# Patient Record
Sex: Female | Born: 1955 | Race: Black or African American | Hispanic: No | Marital: Married | Smoking: Never smoker
Health system: Southern US, Community
[De-identification: ages and names within clinical notes are randomized; demographics above are authoritative.]

## PROBLEM LIST (undated history)

## (undated) DIAGNOSIS — G459 Transient cerebral ischemic attack, unspecified: Secondary | ICD-10-CM

## (undated) DIAGNOSIS — F32A Depression, unspecified: Secondary | ICD-10-CM

## (undated) DIAGNOSIS — G4733 Obstructive sleep apnea (adult) (pediatric): Secondary | ICD-10-CM

## (undated) DIAGNOSIS — I10 Essential (primary) hypertension: Secondary | ICD-10-CM

## (undated) DIAGNOSIS — E7800 Pure hypercholesterolemia, unspecified: Secondary | ICD-10-CM

## (undated) DIAGNOSIS — F419 Anxiety disorder, unspecified: Secondary | ICD-10-CM

## (undated) DIAGNOSIS — L299 Pruritus, unspecified: Secondary | ICD-10-CM

## (undated) DIAGNOSIS — E039 Hypothyroidism, unspecified: Secondary | ICD-10-CM

## (undated) DIAGNOSIS — E119 Type 2 diabetes mellitus without complications: Secondary | ICD-10-CM

## (undated) DIAGNOSIS — E876 Hypokalemia: Secondary | ICD-10-CM

## (undated) DIAGNOSIS — T4145XA Adverse effect of unspecified anesthetic, initial encounter: Secondary | ICD-10-CM

## (undated) DIAGNOSIS — F329 Major depressive disorder, single episode, unspecified: Secondary | ICD-10-CM

## (undated) DIAGNOSIS — T8859XA Other complications of anesthesia, initial encounter: Secondary | ICD-10-CM

## (undated) DIAGNOSIS — G35 Multiple sclerosis: Secondary | ICD-10-CM

## (undated) HISTORY — PX: PARTIAL HYSTERECTOMY: SHX80

## (undated) HISTORY — DX: Multiple sclerosis: G35

## (undated) HISTORY — DX: Pure hypercholesterolemia, unspecified: E78.00

## (undated) HISTORY — DX: Obstructive sleep apnea (adult) (pediatric): G47.33

## (undated) HISTORY — DX: Hypokalemia: E87.6

## (undated) HISTORY — PX: OTHER SURGICAL HISTORY: SHX169

## (undated) HISTORY — DX: Pruritus, unspecified: L29.9

## (undated) HISTORY — DX: Essential (primary) hypertension: I10

## (undated) HISTORY — DX: Hypothyroidism, unspecified: E03.9

---

## 1998-07-03 ENCOUNTER — Ambulatory Visit (HOSPITAL_COMMUNITY): Admission: RE | Admit: 1998-07-03 | Discharge: 1998-07-03 | Payer: Self-pay | Admitting: Internal Medicine

## 1998-10-24 ENCOUNTER — Emergency Department (HOSPITAL_COMMUNITY): Admission: EM | Admit: 1998-10-24 | Discharge: 1998-10-24 | Payer: Self-pay | Admitting: Emergency Medicine

## 1999-09-26 ENCOUNTER — Other Ambulatory Visit: Admission: RE | Admit: 1999-09-26 | Discharge: 1999-09-26 | Payer: Self-pay | Admitting: Internal Medicine

## 1999-09-26 ENCOUNTER — Encounter: Payer: Self-pay | Admitting: Internal Medicine

## 1999-09-26 ENCOUNTER — Encounter: Admission: RE | Admit: 1999-09-26 | Discharge: 1999-09-26 | Payer: Self-pay | Admitting: Internal Medicine

## 1999-12-15 ENCOUNTER — Ambulatory Visit (HOSPITAL_COMMUNITY): Admission: RE | Admit: 1999-12-15 | Discharge: 1999-12-15 | Payer: Self-pay | Admitting: Pediatrics

## 1999-12-15 ENCOUNTER — Encounter: Payer: Self-pay | Admitting: Pediatrics

## 1999-12-29 ENCOUNTER — Encounter: Payer: Self-pay | Admitting: Internal Medicine

## 1999-12-29 ENCOUNTER — Ambulatory Visit (HOSPITAL_COMMUNITY): Admission: RE | Admit: 1999-12-29 | Discharge: 1999-12-29 | Payer: Self-pay | Admitting: Internal Medicine

## 2000-12-27 ENCOUNTER — Encounter: Payer: Self-pay | Admitting: Psychiatry

## 2000-12-27 ENCOUNTER — Ambulatory Visit (HOSPITAL_COMMUNITY): Admission: RE | Admit: 2000-12-27 | Discharge: 2000-12-27 | Payer: Self-pay | Admitting: Psychiatry

## 2002-04-26 ENCOUNTER — Emergency Department (HOSPITAL_COMMUNITY): Admission: EM | Admit: 2002-04-26 | Discharge: 2002-04-26 | Payer: Self-pay | Admitting: Emergency Medicine

## 2002-04-26 ENCOUNTER — Encounter: Payer: Self-pay | Admitting: Emergency Medicine

## 2002-05-07 ENCOUNTER — Encounter: Payer: Self-pay | Admitting: Internal Medicine

## 2002-05-07 ENCOUNTER — Encounter: Admission: RE | Admit: 2002-05-07 | Discharge: 2002-05-07 | Payer: Self-pay | Admitting: Internal Medicine

## 2003-10-15 ENCOUNTER — Encounter: Admission: RE | Admit: 2003-10-15 | Discharge: 2003-10-15 | Payer: Self-pay | Admitting: Neurosurgery

## 2003-10-29 ENCOUNTER — Encounter: Admission: RE | Admit: 2003-10-29 | Discharge: 2003-10-29 | Payer: Self-pay | Admitting: Neurosurgery

## 2003-11-09 ENCOUNTER — Encounter: Admission: RE | Admit: 2003-11-09 | Discharge: 2003-11-09 | Payer: Self-pay | Admitting: Neurosurgery

## 2003-12-27 ENCOUNTER — Encounter: Admission: RE | Admit: 2003-12-27 | Discharge: 2003-12-27 | Payer: Self-pay | Admitting: Neurosurgery

## 2004-01-17 ENCOUNTER — Encounter: Admission: RE | Admit: 2004-01-17 | Discharge: 2004-01-17 | Payer: Self-pay | Admitting: Internal Medicine

## 2004-10-12 ENCOUNTER — Ambulatory Visit (HOSPITAL_COMMUNITY): Admission: RE | Admit: 2004-10-12 | Discharge: 2004-10-12 | Payer: Self-pay | Admitting: Obstetrics and Gynecology

## 2005-10-24 ENCOUNTER — Ambulatory Visit (HOSPITAL_BASED_OUTPATIENT_CLINIC_OR_DEPARTMENT_OTHER): Admission: RE | Admit: 2005-10-24 | Discharge: 2005-10-24 | Payer: Self-pay | Admitting: Internal Medicine

## 2005-10-28 ENCOUNTER — Ambulatory Visit: Payer: Self-pay | Admitting: Internal Medicine

## 2005-11-27 ENCOUNTER — Ambulatory Visit (HOSPITAL_BASED_OUTPATIENT_CLINIC_OR_DEPARTMENT_OTHER): Admission: RE | Admit: 2005-11-27 | Discharge: 2005-11-27 | Payer: Self-pay | Admitting: Internal Medicine

## 2005-11-29 ENCOUNTER — Ambulatory Visit: Payer: Self-pay | Admitting: Internal Medicine

## 2005-12-02 ENCOUNTER — Ambulatory Visit: Payer: Self-pay | Admitting: Internal Medicine

## 2005-12-27 ENCOUNTER — Ambulatory Visit: Payer: Self-pay | Admitting: Internal Medicine

## 2006-02-27 ENCOUNTER — Ambulatory Visit (HOSPITAL_COMMUNITY): Admission: RE | Admit: 2006-02-27 | Discharge: 2006-02-27 | Payer: Self-pay | Admitting: Internal Medicine

## 2006-03-07 ENCOUNTER — Encounter: Admission: RE | Admit: 2006-03-07 | Discharge: 2006-03-07 | Payer: Self-pay | Admitting: Internal Medicine

## 2006-03-12 ENCOUNTER — Ambulatory Visit: Payer: Self-pay | Admitting: Internal Medicine

## 2006-05-03 ENCOUNTER — Encounter: Admission: RE | Admit: 2006-05-03 | Discharge: 2006-05-03 | Payer: Self-pay | Admitting: Internal Medicine

## 2007-03-10 ENCOUNTER — Encounter: Admission: RE | Admit: 2007-03-10 | Discharge: 2007-03-10 | Payer: Self-pay | Admitting: Internal Medicine

## 2007-03-14 ENCOUNTER — Encounter: Admission: RE | Admit: 2007-03-14 | Discharge: 2007-03-14 | Payer: Self-pay | Admitting: Internal Medicine

## 2007-10-13 ENCOUNTER — Encounter: Admission: RE | Admit: 2007-10-13 | Discharge: 2007-10-13 | Payer: Self-pay | Admitting: Internal Medicine

## 2007-10-21 ENCOUNTER — Encounter: Admission: RE | Admit: 2007-10-21 | Discharge: 2007-10-21 | Payer: Self-pay | Admitting: *Deleted

## 2008-03-10 ENCOUNTER — Encounter: Admission: RE | Admit: 2008-03-10 | Discharge: 2008-03-10 | Payer: Self-pay | Admitting: Internal Medicine

## 2010-02-21 ENCOUNTER — Ambulatory Visit (HOSPITAL_COMMUNITY): Admission: RE | Admit: 2010-02-21 | Discharge: 2010-02-21 | Payer: Self-pay | Admitting: Psychiatry

## 2010-05-16 ENCOUNTER — Encounter: Admission: RE | Admit: 2010-05-16 | Discharge: 2010-05-16 | Payer: Self-pay | Admitting: Psychiatry

## 2010-10-28 ENCOUNTER — Encounter: Payer: Self-pay | Admitting: Neurosurgery

## 2010-10-29 ENCOUNTER — Encounter: Payer: Self-pay | Admitting: Psychiatry

## 2010-10-29 ENCOUNTER — Encounter: Payer: Self-pay | Admitting: Internal Medicine

## 2010-10-30 ENCOUNTER — Encounter: Payer: Self-pay | Admitting: Internal Medicine

## 2011-01-18 ENCOUNTER — Encounter: Payer: Self-pay | Admitting: Internal Medicine

## 2011-02-23 NOTE — Procedures (Signed)
NAME:  Kendra Simon, Kendra Simon NO.:  [id number]   MEDICAL RECORD NO.:  [id number]          PATIENT TYPE:  OUT   LOCATION:  SLEEP CENTER                 FACILITY:  [facility]   PHYSICIAN:  Clinton D. Maple Hudson, M.D. DATE OF BIRTH:  1955-12-07   DATE OF STUDY:  10/24/2005                              NOCTURNAL POLYSOMNOGRAM   REFERRING PHYSICIAN:  Dr. Willey Blade   INDICATION FOR STUDY:  Hypersomnia with sleep apnea. Epworth sleepiness  score 13/24, weight 198 pounds.   SLEEP ARCHITECTURE:  Total sleep time 347 minutes with sleep efficiency 88%.  Stage 1 was 3%, stage 2 74%, stages 3 and 4 15%, REM 8% of total sleep time.  Sleep latency 33 minutes, REM latency 183 minutes, awake after sleep onset  16 minutes, arousal index 6.7. She had taken an injection for her multiple  sclerosis prior to lights out. No other bedtime medication reported.   RESPIRATORY DATA:  Apnea/hypopnea index (AHI, RDI) 26.8 obstructive events  per hour indicating moderate obstructive sleep apnea/hypopnea syndrome. This  included two central apneas, 26 obstructive apneas, and 127 hypopneas. Most  sleep was on back. Events were not positional, equally reported while supine  and on either side. REM AHI 30.5 per hour. Events occurred late in the night  and technician indicates that there were insufficient early events to permit  use of CPAP titration by split protocol on this study night.   OXYGEN DATA:  Loud variable snoring with oxygen desaturation to a nadir of  87%. Mean oxygen saturation through the study was 95% on room air.   CARDIAC DATA:  Normal sinus rhythm.   MOVEMENT/PARASOMNIA:  Occasional leg jerk with little effect on sleep.   IMPRESSION/RECOMMENDATION:  1.  Moderate obstructive sleep apnea/hypopnea syndrome, AHI 26.8 per hour      with snoring variable and sometimes loud, oxygen desaturation to 87%.      Events were not positional.  2.  Consider return for CPAP titration or evaluate for  alternative therapies      as appropriate.      Clinton D. Maple Hudson, M.D.  Diplomate, Biomedical engineer of Sleep Medicine  Electronically Signed     CDY/MEDQ  D:  10/28/2005 12:18:35  T:  10/29/2005 16:56:37  Job:  [id number]

## 2011-02-23 NOTE — Procedures (Signed)
NAME:  DEEPTI, GUNAWAN NO.:  [id number]   MEDICAL RECORD NO.:  [id number]          PATIENT TYPE:  OUT   LOCATION:  SLEEP CENTER                 FACILITY:  [facility]   PHYSICIAN:  Clinton D. Maple Hudson, M.D. DATE OF BIRTH:  [birth year]   DATE OF STUDY:  11/27/2005                              NOCTURNAL POLYSOMNOGRAM   REFERRING PHYSICIAN:  Dr. Willey Blade.   DATE OF STUDY:  November 27, 2005.   INDICATION FOR STUDY:  Hypersomnia with sleep apnea.   EPWORTH SLEEPINESS SCORE:  14/24.   BMI:  35.   WEIGHT:  198 pounds.   A baseline diagnostic NPSG on October 24, 2005 had reported an AHI of 26.8  per hour. C-PAP titration is requested.   HOME MEDICATIONS:  Xanax, Prozac, Maxzide, Caduet, Betaseron.   SLEEP ARCHITECTURE:  Total sleep time 325 minutes with sleep efficiency 82%.  Stage I was 15%, stage II 63%, stages III and IV 10%, REM 12% of total sleep  time. Sleep latency 11 minutes, REM latency 342 minutes, awake after sleep  onset 61 minutes, arousal index 21.9. She had taken an injection for her  multiple sclerosis prior to lights out.   RESPIRATORY DATA:  C-PAP titration protocol: C-PAP was titrated to 9 CWP,  AHI 4.2 per hour. A small Respironics ComfortGel mask was used with a heated  humidifier. She was able to sleep with this but indicated on morning  questionnaire that she had felt smothered and frightened initially with C-  PAP.   OXYGEN DATA:  Snoring was prevented and oxygen saturation held at 97-98% on  C-PAP.   CARDIAC DATA:  Normal sinus rhythm.   MOVEMENT/PARASOMNIA:  A total of 39 limb jerks were reported of which 19  were associated with arousal or awakening for a periodic limb movement with  arousal index of 3.5 per hour which is mildly increased but may be affected  by the disturbances C-PAP titration on the study night.   IMPRESSION/RECOMMENDATIONS:  1.  Successful C-PAP titration to 9 CWP, AHI 4.2 per hour using a small      Respironics  ComfortGel mask with heated humidifier. Note that she was      uncomfortable with the pressure sensation of C-PAP at least initially.  2.  Baseline diagnostic NPSG on October 24, 2005 had reported an AHI of 26.8      per hour.  3.  Mild periodic limb movement with arousal, 3.5 per hour.      Clinton D. Maple Hudson, M.D.  Diplomate, Biomedical engineer of Sleep Medicine  Electronically Signed     CDY/MEDQ  D:  12/02/2005 10:47:55  T:  12/02/2005 22:41:30  Job:  [id number]

## 2011-04-17 ENCOUNTER — Encounter (HOSPITAL_COMMUNITY): Payer: Self-pay | Admitting: Radiology

## 2011-04-17 ENCOUNTER — Emergency Department (HOSPITAL_COMMUNITY): Payer: 59

## 2011-04-17 ENCOUNTER — Observation Stay (HOSPITAL_COMMUNITY)
Admission: EM | Admit: 2011-04-17 | Discharge: 2011-04-18 | DRG: 069 | Disposition: A | Discharge status: Home / Self Care | Payer: 59 | Attending: Internal Medicine | Admitting: Internal Medicine

## 2011-04-17 DIAGNOSIS — E785 Hyperlipidemia, unspecified: Secondary | ICD-10-CM | POA: Diagnosis present

## 2011-04-17 DIAGNOSIS — E039 Hypothyroidism, unspecified: Secondary | ICD-10-CM | POA: Diagnosis present

## 2011-04-17 DIAGNOSIS — F341 Dysthymic disorder: Secondary | ICD-10-CM | POA: Diagnosis present

## 2011-04-17 DIAGNOSIS — R609 Edema, unspecified: Secondary | ICD-10-CM | POA: Diagnosis present

## 2011-04-17 DIAGNOSIS — I10 Essential (primary) hypertension: Secondary | ICD-10-CM | POA: Diagnosis present

## 2011-04-17 DIAGNOSIS — R51 Headache: Secondary | ICD-10-CM | POA: Diagnosis present

## 2011-04-17 DIAGNOSIS — G459 Transient cerebral ischemic attack, unspecified: Principal | ICD-10-CM | POA: Diagnosis present

## 2011-04-17 DIAGNOSIS — G35 Multiple sclerosis: Secondary | ICD-10-CM | POA: Diagnosis present

## 2011-04-17 HISTORY — DX: Transient cerebral ischemic attack, unspecified: G45.9

## 2011-04-17 LAB — POCT I-STAT, CHEM 8
BUN: 18 mg/dL (ref 6–23)
Calcium, Ion: 1.05 mmol/L — ABNORMAL LOW (ref 1.12–1.32)
Creatinine, Ser: 0.9 mg/dL (ref 0.50–1.10)
Hemoglobin: 12.6 g/dL (ref 12.0–15.0)
TCO2: 26 mmol/L (ref 0–100)

## 2011-04-17 LAB — DIFFERENTIAL
Basophils Relative: 0 % (ref 0–1)
Eosinophils Absolute: 0.1 10*3/uL (ref 0.0–0.7)
Eosinophils Relative: 1 % (ref 0–5)
Monocytes Absolute: 0.4 10*3/uL (ref 0.1–1.0)
Monocytes Relative: 6 % (ref 3–12)

## 2011-04-17 LAB — CBC
Hemoglobin: 12.6 g/dL (ref 12.0–15.0)
MCH: 32 pg (ref 26.0–34.0)
MCHC: 34.8 g/dL (ref 30.0–36.0)
Platelets: 298 10*3/uL (ref 150–400)
RDW: 12.5 % (ref 11.5–15.5)

## 2011-04-18 ENCOUNTER — Inpatient Hospital Stay (HOSPITAL_COMMUNITY): Payer: 59

## 2011-04-18 DIAGNOSIS — G459 Transient cerebral ischemic attack, unspecified: Secondary | ICD-10-CM

## 2011-04-18 DIAGNOSIS — M79609 Pain in unspecified limb: Secondary | ICD-10-CM

## 2011-04-18 DIAGNOSIS — I059 Rheumatic mitral valve disease, unspecified: Secondary | ICD-10-CM

## 2011-04-18 LAB — COMPREHENSIVE METABOLIC PANEL
Albumin: 3.3 g/dL — ABNORMAL LOW (ref 3.5–5.2)
BUN: 16 mg/dL (ref 6–23)
Calcium: 9 mg/dL (ref 8.4–10.5)
Chloride: 103 mEq/L (ref 96–112)
Creatinine, Ser: 0.72 mg/dL (ref 0.50–1.10)
Total Bilirubin: 0.2 mg/dL — ABNORMAL LOW (ref 0.3–1.2)
Total Protein: 6.7 g/dL (ref 6.0–8.3)

## 2011-04-18 LAB — CARDIAC PANEL(CRET KIN+CKTOT+MB+TROPI)
CK, MB: 3 ng/mL (ref 0.3–4.0)
Relative Index: 0.9 (ref 0.0–2.5)
Total CK: 318 U/L — ABNORMAL HIGH (ref 7–177)
Troponin I: <0.3 ng/mL (ref <0.30)

## 2011-04-18 LAB — URINALYSIS, MICROSCOPIC ONLY
Glucose, UA: NEGATIVE mg/dL
Hgb urine dipstick: NEGATIVE
Ketones, ur: NEGATIVE mg/dL
Leukocytes, UA: NEGATIVE
Protein, ur: NEGATIVE mg/dL
Urobilinogen, UA: 1 mg/dL (ref 0.0–1.0)

## 2011-04-18 LAB — APTT: aPTT: 32 seconds (ref 24–37)

## 2011-04-18 LAB — CBC
HCT: 35.3 % — ABNORMAL LOW (ref 36.0–46.0)
Hemoglobin: 11.9 g/dL — ABNORMAL LOW (ref 12.0–15.0)
MCH: 30.9 pg (ref 26.0–34.0)
MCV: 91.7 fL (ref 78.0–100.0)
RBC: 3.85 MIL/uL — ABNORMAL LOW (ref 3.87–5.11)
WBC: 6.3 10*3/uL (ref 4.0–10.5)

## 2011-04-18 LAB — GLUCOSE, CAPILLARY: Glucose-Capillary: 140 mg/dL — ABNORMAL HIGH (ref 70–99)

## 2011-04-18 LAB — HEMOGLOBIN A1C: Hgb A1c MFr Bld: 6.7 % — ABNORMAL HIGH (ref <5.7)

## 2011-04-18 LAB — DIFFERENTIAL
Lymphocytes Relative: 26 % (ref 12–46)
Lymphs Abs: 1.6 10*3/uL (ref 0.7–4.0)
Monocytes Relative: 9 % (ref 3–12)
Neutrophils Relative %: 63 % (ref 43–77)

## 2011-04-18 LAB — MAGNESIUM: Magnesium: 2.1 mg/dL (ref 1.5–2.5)

## 2011-04-18 LAB — PHOSPHORUS: Phosphorus: 3 mg/dL (ref 2.3–4.6)

## 2011-04-18 LAB — T4, FREE: Free T4: 1 ng/dL (ref 0.80–1.80)

## 2011-04-18 LAB — LIPID PANEL
Cholesterol: 168 mg/dL (ref 0–200)
HDL: 37 mg/dL — ABNORMAL LOW (ref >39)

## 2011-04-18 LAB — VITAMIN B12: Vitamin B-12: 296 pg/mL (ref 211–911)

## 2011-04-19 LAB — FOLATE RBC: RBC Folate: 359 ng/mL — ABNORMAL LOW (ref >=366)

## 2011-04-19 NOTE — H&P (Signed)
NAME:  Kendra Simon, Kendra Simon NO.:  [id number]  MEDICAL RECORD NO.:  [id number]  LOCATION:  [facility]                         FACILITY:  [facility]  PHYSICIAN:  Valetta Close, M.D.   DATE OF BIRTH:  [birth year]  DATE OF ADMISSION:  04/17/2011 DATE OF DISCHARGE:                             HISTORY & PHYSICAL   CHIEF COMPLAINT:  Left facial numbness that is recurrent and dysarthria and left leg weakness.  HISTORY OF PRESENT ILLNESS:  This is a 55 year old female with a history of multiple sclerosis that was diagnosed 15 years ago for which she takes interferon who yesterday saw her feeling a little fatigued.  She did a short drive to [place] to visit people.  Meanwhile she was there, she developed left leg weakness and this improved gradually, again noted left face was droopy, this lasted 30 minutes.  Her left leg weakness again went away and left side of her face improved.  It was still a bit norm, however she thought it would be good enough to drive home, where she did driving back 2 hours and when she got home, she felt that she was okay, so she went to sleep.  She woke up this morning again feeling fatigued and notes that her speech was slurred.  She also had 3 difficult time finding the right words.  Her legs were okay, however, she did have an intermittent headache and burning in the left side, although the vision in the left eye was okay.  She now feels tired and has an intermittent headache, but otherwise she is fine.  PAST MEDICAL HISTORY: 1. Multiple sclerosis. 2. Hypertension. 3. Hyperlipidemia. 4. Constipation. 5. Edema in her legs and she notes the edema in her legs was worse     yesterday. 6. Depression and anxiety.  She notes no recent episodes of provoked     depression or anxiety.  She was recently seen by a neurologist and     given a clean bill of health.  She also recently had a interferon     tried, that was on Saturday.  REVIEW OF SYSTEMS:  A  10-point review of system was performed, and otherwise negative.  ALLERGIES:  She has no known drug allergies.  LABORATORY DATA:  No alcohol.  No tobacco.  No drugs.  She is a full code.  Does appear that she has OSA, on CPAP.  PHYSICAL EXAMINATION:  VITAL SIGNS:  Temperature 98.5, heart rate 70, blood pressure 130/75, and O2 sat 97% on room air. GENERAL:  She appears in her stated age and is in no apparent distress. SKIN:  Cool, dry, otherwise unremarkable. HEENT:  Eyes are anicteric.  She has moist oral mucosa. LUNGS:  Clear to auscultation bilaterally. CARDIAC:  Regular rate and rhythm with no murmur. ABDOMEN:  Bowel sounds are positive.  No tenderness, rebound or guarding. RECTAL:  Deferred. GU:  With no CVA tenderness. EXTREMITIES:  Legs with no edema. NEUROLOGIC:  Cranial nerves II through XII are intact.  She does take a bit of time to answer questions and her family notes, she is not as "perky," but otherwise her speech is at baseline.  She has no dysarthria to  me.  She is right-handed.  Her smile is equal and symmetrical.  Her cranial nerve V exam is normal and again cranial nerves II through XII are intact and equal bilaterally.  She is alert and oriented x3. PSYCHIATRIC:  Appropriate.  LABORATORY DATA:  White count 7, hemoglobin 12.6, hematocrit 36, platelets 298.  Sodium 138, potassium 3, chloride 104, bicarb 26, BUN 18, creatinine is 0.9, and glucose 164.  CT of the head is negative.  ASSESSMENT/PLAN: 1. Left facial numbness and dysarthria.  I am going to put her on a     TIA pathway, I am going to get an MRI of the brain, 2-D echo, and     carotid Dopplers.  Based on the MRI findings, I might go ahead and     consult Neurology or start her on IV steroid.  I am going to hold     her off on steroids for now and even that would be a way to treat     this if there was an MS flare.  Just because of symptoms, I have     gone away, but again if her symptoms worsen  certainly steroids are     reasonable.  I am going to hold off on giving her contrast to the     MRI because, it is associated with extreme anxiety because she has     a relative who passed away after receiving IV contrast, it was     likely that was IV dye with a CT scan, but I do not want to cause     more anxiety for her and I think the carotid Dopplers and MRI and     MRA of the head without contrast should be reasonably good.  We are     going to receive PT exam with contrast if we need to, and again     check the carotid Doppler and again check the carotid Doppler and I     will put her on aspirin 325 mg.  I will screen her up for     hyperlipidemia to see how her cholesterol is controlled and check     her hemoglobin A1c and risk stratification. 2. Multiple sclerosis as above.  We may consider consulting neuro in     the a.m. and we will again put her on stroke pathway. 3. Hypokalemia.  We will replete, check a mag, check a phos. 4. Anxiety and depression.  I will resume her home meds, but currently     I do not know what they are. 5. Constipation.  Continue with p.r.n. constipation medications. 6. Hypertension.  I will just continue her antihypertensive     medications when I found her blood pressure is currently at target     and I would not want to make it any lower in the acute setting.     Medication list at this time is pending.  Time spent on this admission was approximately 40 minutes.     Valetta Close, M.D.     JC/MEDQ  D:  04/17/2011  T:  04/17/2011  Job:  [id number]  cc:   Minerva Areola L. August Saucer, M.D. Harriette Bouillon  Electronically Signed by Valetta Close M.D. on 04/19/2011 04:53:36 PM

## 2011-11-26 ENCOUNTER — Ambulatory Visit (HOSPITAL_BASED_OUTPATIENT_CLINIC_OR_DEPARTMENT_OTHER): Payer: 59 | Attending: Internal Medicine | Admitting: General Practice

## 2011-11-26 VITALS — Ht 62.0 in | Wt 197.0 lb

## 2011-11-26 DIAGNOSIS — G4733 Obstructive sleep apnea (adult) (pediatric): Secondary | ICD-10-CM | POA: Insufficient documentation

## 2011-12-01 DIAGNOSIS — R0609 Other forms of dyspnea: Secondary | ICD-10-CM

## 2011-12-01 DIAGNOSIS — G4733 Obstructive sleep apnea (adult) (pediatric): Secondary | ICD-10-CM

## 2011-12-01 DIAGNOSIS — R0989 Other specified symptoms and signs involving the circulatory and respiratory systems: Secondary | ICD-10-CM

## 2011-12-01 NOTE — Procedures (Signed)
NAME:  Kendra Simon, Kendra Simon NO.:  [id number]  MEDICAL RECORD NO.:  [id number]          PATIENT TYPE:  OUT  LOCATION:  SLEEP CENTER                 FACILITY:  [facility]  PHYSICIAN:  Carmel Garfield D. Maple Hudson, MD, FCCP, FACPDATE OF BIRTH:  09/29/1956  DATE OF STUDY:  11/26/2011                           NOCTURNAL POLYSOMNOGRAM  REFERRING PHYSICIAN:  Minerva Areola L. August Saucer, M.D.  INDICATION FOR STUDY:  Hypersomnia with sleep apnea.  EPWORTH SLEEPINESS SCORE:  Epworth sleepiness score 10/24.  BMI 36, weight 197 pounds, height 62 inches is neck 14 inch.  MEDICATIONS:  Charted and reviewed.  A previous diagnostic NPSG on October 24, 2005 had recorded an AHI of 26.8 per hour.  Weight was 198 pounds.  Subsequent CPAP titration on November 27, 2005 had recorded CPAP 9 giving AHI 4.2 per hour.  SLEEP ARCHITECTURE:  Total sleep time 397.5 minutes with sleep efficiency 90.5%.  Stage I was 10.2%, stage II was 81.8%.  Stage III, absent REM 8.1% of total sleep time.  Sleep latency 10 minutes. REM latency 367 minutes. Awake after sleep onset 30 minutes. Arousal index 23.8.  Bedtime medication:  None.  RESPIRATORY DATA:  Apnea-hypopnea index (AHI) 20.4 per hour.  A total of 135 events was scored including 25 obstructive apneas, 1 central apnea, 109 hypopneas.  Most events were recorded while sleeping supine.  REM AHI 7.5 per hour.  There were insufficient numbers of events in the first hours of sleep to permit application of split protocol, CPAP titration on this study night.  OXYGEN DATA:  Mild snoring with oxygen desaturation to a nadir of 90% and mean oxygen saturation through the study 94.6% on room air.  CARDIAC DATA:  Sinus rhythm with occasional PVC.  MOVEMENT-PARASOMNIA:  No significant movement disturbance.  No bathroom trips.  IMPRESSIONS-RECOMMENDATIONS: 1. Moderate obstructive sleep apnea/hypopnea syndrome, AHI 20.4 per     hour with events most common while supine.  Mild snoring  with     oxygen desaturation to a nadir of 90% and mean oxygen saturation     through the study of 94.6% on room air. 2. There were insufficient numbers of early events to meet protocol     requirements for application of CPAP titration by split protocol on     this study night.  Consider return for dedicated CPAP titration or     evaluate for alternative management as clinically appropriate. 3. Baseline diagnostic NPSG on October 24, 2005 had recorded an AHI of     26.8 per hour.  Body weight then was 198 pounds.  CPAP titration on November 27, 2005,to 9 CWP gave AHI 4.2 per hour.     Bathsheba Durrett D. Maple Hudson, MD, [facility], FACP Diplomate, American Board of Sleep Medicine    CDY/MEDQ  D:  12/01/2011 11:47:39  T:  12/01/2011 13:13:00  Job:  [id number]

## 2012-01-01 ENCOUNTER — Ambulatory Visit (HOSPITAL_BASED_OUTPATIENT_CLINIC_OR_DEPARTMENT_OTHER): Payer: Medicare Other | Attending: Internal Medicine | Admitting: General Practice

## 2012-01-01 VITALS — Ht 62.0 in | Wt 197.0 lb

## 2012-01-01 DIAGNOSIS — G4733 Obstructive sleep apnea (adult) (pediatric): Secondary | ICD-10-CM

## 2012-01-04 DIAGNOSIS — G4733 Obstructive sleep apnea (adult) (pediatric): Secondary | ICD-10-CM

## 2012-01-05 NOTE — Procedures (Signed)
NAME:  Kendra Simon, Kendra Simon NO.:  [id number]  MEDICAL RECORD NO.:  [id number]          PATIENT TYPE:  OUT  LOCATION:  SLEEP CENTER                 [facility]  PHYSICIAN:  Britiney Blahnik D. Maple Hudson, MD, FCCP, FACPDATE OF BIRTH:  [birth year]1958  DATE OF STUDY:  01/01/2012                           NOCTURNAL POLYSOMNOGRAM  REFERRING PHYSICIAN:  Minerva Areola L. August Saucer, M.D.  REFERRING PHYSICIAN:  Eric L. August Saucer, MD  INDICATION FOR STUDY:  Hypersomnia with sleep apnea.  Epworth sleepiness score 10/24.  BMI 36, weight 197 pounds, height 62 inches, neck 14 inches.  HOME MEDICATIONS:  Charted and reviewed.  A previous diagnostic NPSG on October 24, 2005 had recorded an AHI of 26.8 per hour.  Body weight was 198 pounds.  Diagnostic NPSG on November 26, 2011 recorded AHI 20.4 per hour with body weight 197 pounds.  CPAP titration is now requested.  SLEEP ARCHITECTURE:  Total sleep time 357.5 minutes with sleep efficiency 77.3%.  Stage I 3.8%, stage II 76.2%, stage III 14.7%, REM 5.3% of total sleep time.  Sleep latency 61.5 minutes, REM latency 285 minutes, awake after sleep onset 19.5 minutes.  Arousal index 12.1. Bedtime medication:  None.  RESPIRATORY DATA:  CPAP titration protocol.  CPAP was titrated to 7 CWP with AHI good control.  She wore a [employer] for her mask, size small.  Heated humidifier and C-Flex setting of 3.  OXYGEN DATA:  Snoring was prevented by CPAP and mean oxygen saturation held 94.8% on room air.  CARDIAC DATA:  Normal sinus rhythm.  MOVEMENT/PARASOMNIA:  No significant movement disturbance.  No bathroom trips.  IMPRESSION/RECOMMENDATION: 1. Successful CPAP titration to 7 CWP.  She wore a [employer]- For-     Her mask with small cushions, heated humidifier and C-Flex setting     of 3.  Snoring was prevented and mean oxygen saturation held 94.8%     on room air. 2. An original baseline diagnostic NPSG on October 24, 2005 recorded     AHI 26.8 per  hour with body weight 198 pounds.  NPSG on November 26, 2011 recorded AHI 20.4 per hour with body weight 197 pounds.     Zeba Luby D. Maple Hudson, MD, [facility], FACP Diplomate, American Board of Sleep Medicine    CDY/MEDQ  D:  01/04/2012 10:46:16  T:  01/05/2012 00:14:54  Job:  [id number]

## 2012-02-15 ENCOUNTER — Institutional Professional Consult (permissible substitution): Payer: 59 | Admitting: Pulmonary Disease

## 2012-02-17 ENCOUNTER — Encounter: Payer: Self-pay | Admitting: Pulmonary Disease

## 2012-02-17 DIAGNOSIS — G4733 Obstructive sleep apnea (adult) (pediatric): Secondary | ICD-10-CM | POA: Insufficient documentation

## 2012-02-17 HISTORY — DX: Obstructive sleep apnea (adult) (pediatric): G47.33

## 2012-03-05 ENCOUNTER — Ambulatory Visit (INDEPENDENT_AMBULATORY_CARE_PROVIDER_SITE_OTHER): Payer: 59 | Admitting: Pulmonary Disease

## 2012-03-05 ENCOUNTER — Encounter: Payer: Self-pay | Admitting: Pulmonary Disease

## 2012-03-05 VITALS — BP 140/82 | HR 64 | Temp 98.5°F | Ht 62.0 in | Wt 194.6 lb

## 2012-03-05 DIAGNOSIS — G4733 Obstructive sleep apnea (adult) (pediatric): Secondary | ICD-10-CM

## 2012-03-05 NOTE — Patient Instructions (Signed)
Will arrange for new CPAP machine Will arrange for new CPAP mask Will call with report of CPAP download Follow up in 4 months

## 2012-03-05 NOTE — Progress Notes (Deleted)
  Subjective:    Patient ID: Kendra Simon, female    DOB: 1955/10/30, 56 y.o.   MRN: [id number]  HPI    Review of Systems  Constitutional: Positive for unexpected weight change. Negative for fever.  HENT: Positive for trouble swallowing and sinus pressure. Negative for ear pain, nosebleeds, congestion, sore throat, rhinorrhea, sneezing, dental problem and postnasal drip.   Eyes: Negative for redness and itching.  Respiratory: Negative for cough, chest tightness, shortness of breath and wheezing.   Cardiovascular: Positive for leg swelling. Negative for palpitations.  Gastrointestinal: Negative for nausea and vomiting.  Genitourinary: Negative for dysuria.  Musculoskeletal: Positive for joint swelling.  Skin: Negative for rash.  Neurological: Negative for headaches.  Hematological: Bruises/bleeds easily.  Psychiatric/Behavioral: Positive for dysphoric mood. The patient is nervous/anxious.        Objective:   Physical Exam        Assessment & Plan:

## 2012-03-05 NOTE — Assessment & Plan Note (Signed)
She has moderate sleep apnea.    I have reviewed her sleep test results with the patient.  Explained how sleep apnea can affect the patient's health.  Driving precautions and importance of weight loss were discussed.  Treatment options for sleep apnea were reviewed.  She is due for a new CPAP machine, and needs a different mask.  Will arrange for these and get a copy of her download with new machine.  This may be all she needs to have done for her sleep apnea.  Advised her to d/w her neurologist if she could switch from ritalin to nuvigil.

## 2012-03-05 NOTE — Progress Notes (Signed)
Chief Complaint  Patient presents with  . SLEEP CONSULT    REFERRED BY DR ERIC Kendra Simon    CC: Kendra Simon  History of Present Illness: Kendra Simon is a 55 y.o. female for evaluation of sleep apnea.  She was seen by Dr. Fannie Knee 10 years ago for sleep apnea.  She had a CPAP machine, but was not using this until recently.  She has a history of multiple sclerosis, and was started on a new medication for this.  She is followed by Dr. Trudie Simon for her MS.  She was concerned this could be affecting her sleep.  As as result she had a repeat sleep study 11/26/11>>AHI 20.4, SpO2 low 90%.  She then had CPAP titration 12/08/11>>CPAP 7 cm H2O.  She has not felt like restarting CPAP has helped her sleep or energy as much as she had hoped.  She goes to bed at 10 pm.  She takes about 30 minutes to fall asleep.  She wakes up once or twice to use the bathroom.  She can sometimes take awhile to fall back to sleep.  She gets out of bed at 830 am.  She has more trouble sleeping when she has more stress.  She denies morning headaches.  She does grind her teeth while asleep.  She is not using anything to help her sleep at night.  She was using provigil to help with daytime fatigue.  This helped, but she had to switch to ritalin due to insurance issues.  She is using her CPAP every night now.  This helps some.  She will nap for up to 4 hours at a time.  She does not use her CPAP during naps.  She liked the CPAP mask she used during her sleep test better than her current mask.  She does not really have a home care company that she has been working with recently.  She still has the same machine she was set up with 10 years ago.  The patient denies sleep walking, sleep talking, or nightmares.  There is no history of restless legs.  The patient denies sleep hallucinations, sleep paralysis, or cataplexy.  Epworth score is 15 out of 24.   Past Medical History  Diagnosis Date  . Essential hypertension,  malignant   . Hypopotassemia   . Multiple sclerosis   . Pure hypercholesterolemia   . Unspecified hypothyroidism   . Unspecified pruritic disorder   . TIA (transient ischemic attack)     Past Surgical History  Procedure Date  . Partial hysterectomy     Current Outpatient Prescriptions on File Prior to Visit  Medication Sig Dispense Refill  . ACIPHEX 20 MG tablet Take 20 mg by mouth daily.       Marland Kitchen amLODipine (NORVASC) 2.5 MG tablet Take 2.5 mg by mouth daily.       . Cholecalciferol (VITAMIN D3 PO) Take by mouth daily.       Marland Kitchen GILENYA 0.5 MG CAPS Take 0.5 mg by mouth daily.       Marland Kitchen levothyroxine (SYNTHROID, LEVOTHROID) 50 MCG tablet Take 50 mcg by mouth daily.       . metFORMIN (GLUCOPHAGE) 500 MG tablet Take 500 mg by mouth. 1/2 TABLET DAILY        . methylphenidate (RITALIN) 20 MG tablet Take 20 mg by mouth once.       Marland Kitchen PROZAC 20 MG capsule 20 mg daily.       . simvastatin (ZOCOR) 20  MG tablet 20 mg daily.       Marland Kitchen triamterene-hydrochlorothiazide (MAXZIDE-25) 37.5-25 MG per tablet Take 1 tablet by mouth daily.       . Gabapentin, PHN, 300 MG TABS Take by mouth 4 (four) times daily.          No Known Allergies  family history includes Emphysema in her maternal grandfather.   reports that she has never smoked. She has never used smokeless tobacco. She reports that she drinks alcohol. She reports that she does not use illicit drugs.  Review of Systems  Constitutional: Positive for unexpected weight change. Negative for fever.  HENT: Positive for trouble swallowing and sinus pressure. Negative for ear pain, nosebleeds, congestion, sore throat, rhinorrhea, sneezing, dental problem and postnasal drip.   Eyes: Negative for redness and itching.  Respiratory: Negative for cough, chest tightness, shortness of breath and wheezing.   Cardiovascular: Positive for leg swelling. Negative for palpitations.  Gastrointestinal: Negative for nausea and vomiting.  Genitourinary: Negative for  dysuria.  Musculoskeletal: Positive for joint swelling.  Skin: Negative for rash.  Neurological: Negative for headaches.  Hematological: Bruises/bleeds easily.  Psychiatric/Behavioral: Positive for dysphoric mood. The patient is nervous/anxious.     Physical Exam: BP 140/82  Pulse 64  Temp(Src) 98.5 F (36.9 C) (Oral)  Ht 5\' 2"  (1.575 m)  Wt 194 lb 9.6 oz (88.27 kg)  BMI 35.59 kg/m2  SpO2 98% Body mass index is 35.59 kg/(m^2).   General - No distress HEENT - PERRLA, EOMI, no sinus tenderness, no oral exudate, no LAN Cardiac - s1s2 regular, no murmur Chest - no wheeze/rales Abdomen - soft, non tender Extremities - minimal ankle edema Neurologic - normal strength, CN intact Skin - no rashes Psychiatric - normal mood, behavior  Assessment/Plan:  Outpatient Encounter Prescriptions as of 03/05/2012  Medication Sig Dispense Refill  . ACIPHEX 20 MG tablet Take 20 mg by mouth daily.       Marland Kitchen ALPRAZolam (XANAX) 0.5 MG tablet Take 0.5 mg by mouth 3 (three) times daily as needed.       Marland Kitchen amLODipine (NORVASC) 2.5 MG tablet Take 2.5 mg by mouth daily.       . Cholecalciferol (VITAMIN D3 PO) Take by mouth daily.       Marland Kitchen CLOBEX SPRAY 0.05 % external spray       . GILENYA 0.5 MG CAPS Take 0.5 mg by mouth daily.       Marland Kitchen levothyroxine (SYNTHROID, LEVOTHROID) 50 MCG tablet Take 50 mcg by mouth daily.       . metFORMIN (GLUCOPHAGE) 500 MG tablet Take 500 mg by mouth. 1/2 TABLET DAILY        . methylphenidate (RITALIN) 20 MG tablet Take 20 mg by mouth once.       Marland Kitchen PROZAC 20 MG capsule 20 mg daily.       . simvastatin (ZOCOR) 20 MG tablet 20 mg daily.       Marland Kitchen triamterene-hydrochlorothiazide (MAXZIDE-25) 37.5-25 MG per tablet Take 1 tablet by mouth daily.       . Gabapentin, PHN, 300 MG TABS Take by mouth 4 (four) times daily.          Silviano Neuser Pager:  [phone] 03/05/2012, 3:41 PM

## 2012-04-22 ENCOUNTER — Encounter: Payer: Self-pay | Admitting: Pulmonary Disease

## 2012-04-24 ENCOUNTER — Encounter: Payer: Self-pay | Admitting: Pulmonary Disease

## 2012-04-24 ENCOUNTER — Ambulatory Visit (INDEPENDENT_AMBULATORY_CARE_PROVIDER_SITE_OTHER): Payer: 59 | Admitting: Pulmonary Disease

## 2012-04-24 VITALS — BP 112/82 | HR 67 | Temp 98.4°F | Ht 62.0 in | Wt 189.8 lb

## 2012-04-24 DIAGNOSIS — G4733 Obstructive sleep apnea (adult) (pediatric): Secondary | ICD-10-CM

## 2012-04-24 NOTE — Patient Instructions (Signed)
Will adjust CPAP to 8 cm H2O>>call if this does not help Follow up in 6 months

## 2012-04-24 NOTE — Assessment & Plan Note (Signed)
She does not feel as rested as she did before.  She is getting enough sleep.  Her AHI is slightly elevated.  Will increased CPAP from 7 to 8 cm H2O.  She is to call if she is having trouble.

## 2012-04-24 NOTE — Progress Notes (Signed)
Chief Complaint  Patient presents with  . Follow-up    Pt states she wears her cpap machine about 6 nights a week. denies any problems with mask/machine. She is sleeping much better since starting cpap   CC: Kendra Simon  History of Present Illness: Kendra Simon is a 56 y.o. female with OSA.  She goes to bed at 11 pm, and wakes up at 6 am.  She sleeps through the night.  She is not having any trouble with her CPAP.  She does not feel as rested in the morning as she did when she first was set up with CPAP.   Past Medical History  Diagnosis Date  . Essential hypertension, malignant   . Hypopotassemia   . Multiple sclerosis   . Pure hypercholesterolemia   . Unspecified hypothyroidism   . Unspecified pruritic disorder   . TIA (transient ischemic attack)     Past Surgical History  Procedure Date  . Partial hysterectomy     Outpatient Encounter Prescriptions as of 04/24/2012  Medication Sig Dispense Refill  . ACIPHEX 20 MG tablet Take 20 mg by mouth daily.       Marland Kitchen ALPRAZolam (XANAX) 0.5 MG tablet Take 0.5 mg by mouth 3 (three) times daily as needed.       Marland Kitchen amLODipine (NORVASC) 2.5 MG tablet Take 2.5 mg by mouth daily.       . Cholecalciferol (VITAMIN D3 PO) Take by mouth daily.       Marland Kitchen CLOBEX SPRAY 0.05 % external spray       . Gabapentin, PHN, 300 MG TABS Take by mouth 4 (four) times daily.        Marland Kitchen GILENYA 0.5 MG CAPS Take 0.5 mg by mouth daily.       Marland Kitchen levothyroxine (SYNTHROID, LEVOTHROID) 50 MCG tablet Take 50 mcg by mouth daily.       . metFORMIN (GLUCOPHAGE) 500 MG tablet Take 500 mg by mouth. 1/2 TABLET DAILY        . methylphenidate (RITALIN) 20 MG tablet Take 20 mg by mouth once.       Marland Kitchen PROZAC 20 MG capsule 20 mg daily.       . simvastatin (ZOCOR) 20 MG tablet 20 mg daily.       Marland Kitchen triamterene-hydrochlorothiazide (MAXZIDE-25) 37.5-25 MG per tablet Take 1 tablet by mouth daily.         No Known Allergies  Physical Exam:  Blood pressure 112/82, pulse 67,  temperature 98.4 F (36.9 C), temperature source Oral, height 5\' 2"  (1.575 m), weight 189 lb 12.8 oz (86.093 kg), SpO2 96.00%. Body mass index is 34.71 kg/(m^2). Wt Readings from Last 2 Encounters:  04/24/12 189 lb 12.8 oz (86.093 kg)  03/05/12 194 lb 9.6 oz (88.27 kg)   General - No distress  HEENT - PERRLA, EOMI, no sinus tenderness, no oral exudate, no LAN  Cardiac - s1s2 regular, no murmur  Chest - no wheeze/rales  Abdomen - soft, non tender  Extremities - minimal ankle edema  Neurologic - normal strength, CN intact  Skin - no rashes  Psychiatric - normal mood, behavior  CPAP 03/17/12 to 04/16/12>>Used on 26 of 31 nights with average 7 hrs 13 min.  Average AHI 6 with CPAP 7 cm H2O.   Assessment/Plan:  Coralyn Helling, MD [place] [facility] Pager:  [phone] 04/24/2012, 10:55 AM

## 2012-08-27 ENCOUNTER — Other Ambulatory Visit: Payer: Self-pay | Admitting: Psychiatry

## 2012-08-27 DIAGNOSIS — G35 Multiple sclerosis: Secondary | ICD-10-CM

## 2012-09-16 ENCOUNTER — Ambulatory Visit
Admission: RE | Admit: 2012-09-16 | Discharge: 2012-09-16 | Disposition: A | Discharge status: Home / Self Care | Payer: 59 | Source: Ambulatory Visit | Attending: Psychiatry | Admitting: Psychiatry

## 2012-09-16 DIAGNOSIS — G35 Multiple sclerosis: Secondary | ICD-10-CM

## 2012-09-16 MED ORDER — GADOBENATE DIMEGLUMINE 529 MG/ML IV SOLN
17.0000 mL | Freq: Once | INTRAVENOUS | Status: AC | PRN
  Administered 2012-09-16: 17 mL via INTRAVENOUS

## 2012-10-03 ENCOUNTER — Ambulatory Visit: Payer: 59 | Admitting: Sports Medicine

## 2012-10-13 ENCOUNTER — Ambulatory Visit (INDEPENDENT_AMBULATORY_CARE_PROVIDER_SITE_OTHER): Payer: Medicare Other | Admitting: Sports Medicine

## 2012-10-13 ENCOUNTER — Ambulatory Visit
Admission: RE | Admit: 2012-10-13 | Discharge: 2012-10-13 | Disposition: A | Discharge status: Home / Self Care | Payer: BC Managed Care – PPO | Source: Ambulatory Visit | Attending: Sports Medicine | Admitting: Sports Medicine

## 2012-10-13 ENCOUNTER — Ambulatory Visit
Admission: RE | Admit: 2012-10-13 | Discharge: 2012-10-13 | Disposition: A | Discharge status: Home / Self Care | Payer: Medicare Other | Source: Ambulatory Visit | Attending: Sports Medicine | Admitting: Sports Medicine

## 2012-10-13 VITALS — BP 144/90 | Ht 62.0 in | Wt 182.0 lb

## 2012-10-13 DIAGNOSIS — M25559 Pain in unspecified hip: Secondary | ICD-10-CM

## 2012-10-13 DIAGNOSIS — M169 Osteoarthritis of hip, unspecified: Secondary | ICD-10-CM

## 2012-10-13 DIAGNOSIS — M1610 Unilateral primary osteoarthritis, unspecified hip: Secondary | ICD-10-CM

## 2012-10-14 ENCOUNTER — Other Ambulatory Visit: Payer: Self-pay | Admitting: *Deleted

## 2012-10-14 ENCOUNTER — Other Ambulatory Visit: Payer: Self-pay | Admitting: Sports Medicine

## 2012-10-14 ENCOUNTER — Ambulatory Visit: Payer: BC Managed Care – PPO | Admitting: Sports Medicine

## 2012-10-14 DIAGNOSIS — M25559 Pain in unspecified hip: Secondary | ICD-10-CM

## 2012-10-14 NOTE — Progress Notes (Signed)
  Subjective:    Patient ID: Kendra Simon, female    DOB: 1956-08-02, 57 y.o.   MRN: [id number]  HPI chief complaint: Right hip pain  Patient comes in today complaining of 4 months of right hip pain. She has had problems with this same hip in the past. She was seen 2-3 years ago at [facility] and told that she had hip osteoarthritis. She was call that she would need a total hip replacement. She has been tolerating the pain but it has become more constant over the past several weeks. Pain is diffuse around the hip but worse in the right groin. Radiates down the right leg to the knee. No associated numbness or tingling. Pain improves at rest. No prior hip surgeries. She's had lumbar ESI's in the past but her current pain in the right hip is different than the pain she's experienced previously with her low-back. She denies any recent trauma. No fevers or chills.  Medications and past medical history are reviewed. Medical history is significant diabetes, acid reflux disease, hypothyroidism, and multiple sclerosis. Socially she does not smoke, drinks alcohol on a rare occasion, and is disabled.    Review of Systems     Objective:   Physical Exam Well-developed, well-nourished. No acute distress. Awake alert and oriented x3. Vital signs are reviewed  Right hip: Passive internal rotation is limited to about 20. This reproduces groin pain. Full passive external rotation. Pain with resisted hip flexion. There is also some tenderness to palpation over the right greater trochanteric bursa, the majority of her pain is with passive internal hip rotation. Negative straight leg raise. Neurovascular intact distally.  Left hip: Smooth painless hip range of motion with a negative log roll. No pain over the greater trochanteric bursa to direct palpation. Negative straight leg. Neurovascularly intact distally.  Walks with a slight limp.  X-rays including an AP pelvis and bilateral lateral  hips are independently reviewed by me. 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 Nothing acute.       Assessment & Plan:  1. Right hip pain secondary to DJD  I think the patient would benefit best from an intra-articular cortisone injection into the right hip. I will refer her to [facility] imaging for this procedure as she has had good success with him in the past with lumbar ESI. At some point down the road she may need a total hip arthroplasty. If she continues to have lateral hip pain after the injection she will return to the office for cortisone injection into her greater trochanteric bursa. Otherwise, followup when necessary.

## 2012-10-16 ENCOUNTER — Ambulatory Visit
Admission: RE | Admit: 2012-10-16 | Discharge: 2012-10-16 | Disposition: A | Discharge status: Home / Self Care | Payer: BC Managed Care – PPO | Source: Ambulatory Visit | Attending: Sports Medicine | Admitting: Sports Medicine

## 2012-10-16 DIAGNOSIS — M25559 Pain in unspecified hip: Secondary | ICD-10-CM

## 2012-10-16 MED ORDER — IOHEXOL 180 MG/ML  SOLN
1.0000 mL | Freq: Once | INTRAMUSCULAR | Status: AC | PRN
  Administered 2012-10-16: 1 mL via INTRA_ARTICULAR

## 2012-10-16 MED ORDER — METHYLPREDNISOLONE ACETATE 40 MG/ML INJ SUSP (RADIOLOG
120.0000 mg | Freq: Once | INTRAMUSCULAR | Status: AC
  Administered 2012-10-16: 120 mg via INTRA_ARTICULAR

## 2012-10-23 ENCOUNTER — Telehealth: Payer: Self-pay | Admitting: Pulmonary Disease

## 2012-10-23 NOTE — Telephone Encounter (Signed)
Per last OV note pt needs to f/u in 6 months which would be Jan. So I advised the pt to keep appt. Carron Curie, CMA

## 2012-10-24 ENCOUNTER — Ambulatory Visit: Payer: 59 | Admitting: Pulmonary Disease

## 2013-02-02 ENCOUNTER — Ambulatory Visit (INDEPENDENT_AMBULATORY_CARE_PROVIDER_SITE_OTHER): Payer: Medicare Other | Admitting: Sports Medicine

## 2013-02-02 VITALS — BP 128/88 | Ht 62.0 in | Wt 180.0 lb

## 2013-02-02 DIAGNOSIS — M25561 Pain in right knee: Secondary | ICD-10-CM

## 2013-02-02 DIAGNOSIS — M25569 Pain in unspecified knee: Secondary | ICD-10-CM

## 2013-02-02 DIAGNOSIS — M25551 Pain in right hip: Secondary | ICD-10-CM

## 2013-02-02 DIAGNOSIS — M25559 Pain in unspecified hip: Secondary | ICD-10-CM

## 2013-02-02 DIAGNOSIS — M169 Osteoarthritis of hip, unspecified: Secondary | ICD-10-CM

## 2013-02-02 MED ORDER — METHYLPREDNISOLONE ACETATE 40 MG/ML IJ SUSP
40.0000 mg | Freq: Once | INTRAMUSCULAR | Status: AC
  Administered 2013-02-02: 40 mg via INTRA_ARTICULAR

## 2013-02-02 MED ORDER — METHYLPREDNISOLONE ACETATE 40 MG/ML IJ SUSP: 40.0000 mg | Freq: Once | INTRAMUSCULAR | Status: DC

## 2013-02-03 ENCOUNTER — Other Ambulatory Visit: Payer: Self-pay | Admitting: Sports Medicine

## 2013-02-03 DIAGNOSIS — M25551 Pain in right hip: Secondary | ICD-10-CM

## 2013-02-03 NOTE — Progress Notes (Signed)
  Subjective:    Patient ID: Kendra Simon, female    DOB: 29-Mar-1956, 57 y.o.   MRN: [id number]  HPI Patient comes in today with persistent right hip pain. Pain is diffuse. She describes an aching discomfort along the lateral hip with radiating pain into the groin as well as down the leg to the knee. X-rays of 2 months ago showed some moderate degenerative changes in this hip. She underwent an intra-articular cortisone injection at [facility] imaging but per her report the radiologist mentioned trying a second injection if that one didn't work. She really got no symptom relief with that injection. He takes 800 mg of ibuprofen once daily and this does seem to help.    Review of Systems     Objective:   Physical Exam Well-developed, well-nourished. No acute distress. Awake alert and oriented x3. Vital signs are reviewed  Right hip: There is full internal and external rotation with sitting. This does reproduce pain both in the lateral hip and in the groin. There is tenderness to palpation diffusely along the lateral hip. Negative straight leg raise.  Right knee: Full range of motion. No effusion. There is tenderness to palpation along the medial joint line but a negative McMurray's. Good joint stability. Neurovascularly intact distally. Walking with a slight limp  X-rays of the right hip are as above       Assessment & Plan:  1. Right hip pain likely secondary to DJD versus greater trochanteric bursitis 2. Right knee pain secondary to referred pain from hip DJD versus knee DJD  For diagnostic as well as therapeutic reasons I have recommended injecting the right knee with cortisone today. I will also refer the patient back to [facility] imaging for a repeat intra-articular cortisone injection. This injection will also be for diagnostic and therapeutic reasons. Patient will followup with me in the days following that procedure for check on her progress. In the meantime, she can continue  with 800 mg of ibuprofen daily as needed.  Consent obtained and verified. Time-out conducted. Noted no overlying erythema, induration, or other signs of local infection. Skin prepped in a sterile fashion. Topical analgesic spray: Ethyl chloride. Joint: right knee, anterior lateral approach Needle: 25g 1 1/2 inch Completed without difficulty. Meds: 3cc 1% xylocaine, 1cc (40mg ) depomedrol  Advised to call if fevers/chills, erythema, induration, drainage, or persistent bleeding.

## 2013-02-04 ENCOUNTER — Ambulatory Visit
Admission: RE | Admit: 2013-02-04 | Discharge: 2013-02-04 | Disposition: A | Discharge status: Home / Self Care | Payer: BC Managed Care – PPO | Source: Ambulatory Visit | Attending: Sports Medicine | Admitting: Sports Medicine

## 2013-02-04 DIAGNOSIS — M25551 Pain in right hip: Secondary | ICD-10-CM

## 2013-02-04 MED ORDER — IOHEXOL 180 MG/ML  SOLN
1.0000 mL | Freq: Once | INTRAMUSCULAR | Status: AC | PRN
  Administered 2013-02-04: 1 mL via INTRA_ARTICULAR

## 2013-02-04 MED ORDER — METHYLPREDNISOLONE ACETATE 40 MG/ML INJ SUSP (RADIOLOG
120.0000 mg | Freq: Once | INTRAMUSCULAR | Status: AC
  Administered 2013-02-04: 120 mg via INTRA_ARTICULAR

## 2013-02-12 ENCOUNTER — Encounter: Payer: Self-pay | Admitting: Pulmonary Disease

## 2013-02-12 ENCOUNTER — Ambulatory Visit (INDEPENDENT_AMBULATORY_CARE_PROVIDER_SITE_OTHER): Payer: BC Managed Care – PPO | Admitting: Pulmonary Disease

## 2013-02-12 VITALS — BP 122/76 | HR 70 | Temp 98.4°F | Ht 62.0 in | Wt 186.0 lb

## 2013-02-12 DIAGNOSIS — G4733 Obstructive sleep apnea (adult) (pediatric): Secondary | ICD-10-CM

## 2013-02-12 NOTE — Progress Notes (Signed)
Chief Complaint  Patient presents with  . Follow-up    Pt states she wears her CPAP 2-3 nights of the week. she states she just gets in the bed and falls asleep.     CC: Kendra Simon  History of Present Illness: Kendra Simon is a 57 y.o. female with OSA on CPAP 8 cm H2O.  She has been doing well with CPAP.  She sometimes falls asleep before putting her mask on.  She notices her sleep is worse when she does not use CPAP.  Her mask fits well, but she has not gotten a new mask in the past 1 year.  TESTS: PSG 10/24/05>> AHI 26.8 CPAP titration 11/27/05 >> CPAP 9 cm H2O>>AHI 4.2 PSG 11/26/11 >> AHI 20.4, SpO2 low 90% CPAP titration 01/01/12 >> CPAP 7 cm H2O.  ResMed Swift FX- For- Her mask with small cushions, C-Flex setting of 3. CPAP 03/17/12 to 04/16/12 >> Used on 26 of 31 nights with average 7 hrs 13 min.  Average AHI 6 with CPAP 7 cm H2O.  Kendra Simon  has a past medical history of Essential hypertension, malignant; Hypopotassemia; Multiple sclerosis; Pure hypercholesterolemia; Unspecified hypothyroidism; Unspecified pruritic disorder; and TIA (transient ischemic attack).  Kendra Simon  has past surgical history that includes Partial hysterectomy.  Prior to Admission medications   Medication Sig Start Date End Date Taking? Authorizing Provider  ACIPHEX 20 MG tablet Take 20 mg by mouth daily.  02/22/12  Yes Historical Provider, MD  ALPRAZolam Prudy Feeler) 0.5 MG tablet Take 0.5 mg by mouth 3 (three) times daily as needed.  02/06/12  Yes Historical Provider, MD  amLODipine (NORVASC) 2.5 MG tablet Take 2.5 mg by mouth daily.  02/07/12  Yes Historical Provider, MD  Cholecalciferol (VITAMIN D3 PO) Take by mouth daily.    Yes Historical Provider, MD  Gabapentin, PHN, 300 MG TABS Take by mouth 4 (four) times daily.     Yes Historical Provider, MD  GILENYA 0.5 MG CAPS Take 0.5 mg by mouth daily.  02/04/12  Yes Historical Provider, MD  levothyroxine (SYNTHROID, LEVOTHROID) 50 MCG tablet  Take 50 mcg by mouth daily.  02/18/12  Yes Historical Provider, MD  methylphenidate (RITALIN) 20 MG tablet Take 20 mg by mouth once.  01/22/12  Yes Historical Provider, MD  PROZAC 20 MG capsule 20 mg daily.  01/03/12  Yes Historical Provider, MD  simvastatin (ZOCOR) 20 MG tablet 20 mg daily.  02/12/12  Yes Historical Provider, MD  triamterene-hydrochlorothiazide (MAXZIDE-25) 37.5-25 MG per tablet Take 1 tablet by mouth daily.  02/06/12  Yes Historical Provider, MD    No Known Allergies   Physical Exam:  General - No distress ENT - No sinus tenderness, no oral exudate, no LAN Cardiac - s1s2 regular, no murmur Chest - No wheeze/rales/dullness Back - No focal tenderness Abd - Soft, non-tender Ext - No edema Neuro - Normal strength Skin - No rashes Psych - normal mood, and behavior   Assessment/Plan:  Coralyn Helling, MD [place] [facility]/Sleep Pager:  [phone]

## 2013-02-12 NOTE — Assessment & Plan Note (Signed)
Discussed techniques to optimize her use of CPAP.  Otherwise she is doing well with CPAP.  Advised her to contact her DME about getting replacement CPAP supplies.

## 2013-02-12 NOTE — Patient Instructions (Signed)
Follow up in 1 year.

## 2013-02-16 ENCOUNTER — Encounter: Payer: Self-pay | Admitting: General Practice

## 2013-02-18 ENCOUNTER — Ambulatory Visit: Payer: BC Managed Care – PPO | Admitting: Sports Medicine

## 2013-02-26 ENCOUNTER — Ambulatory Visit
Admission: RE | Admit: 2013-02-26 | Discharge: 2013-02-26 | Disposition: A | Discharge status: Home / Self Care | Payer: BC Managed Care – PPO | Source: Ambulatory Visit | Attending: Sports Medicine | Admitting: Sports Medicine

## 2013-02-26 ENCOUNTER — Ambulatory Visit (INDEPENDENT_AMBULATORY_CARE_PROVIDER_SITE_OTHER): Payer: BC Managed Care – PPO | Admitting: Sports Medicine

## 2013-02-26 ENCOUNTER — Encounter: Payer: Self-pay | Admitting: Sports Medicine

## 2013-02-26 VITALS — BP 117/81 | HR 71 | Ht 62.0 in | Wt 183.0 lb

## 2013-02-26 DIAGNOSIS — M25569 Pain in unspecified knee: Secondary | ICD-10-CM

## 2013-02-26 DIAGNOSIS — M1610 Unilateral primary osteoarthritis, unspecified hip: Secondary | ICD-10-CM

## 2013-02-26 DIAGNOSIS — M169 Osteoarthritis of hip, unspecified: Secondary | ICD-10-CM

## 2013-02-26 DIAGNOSIS — M25561 Pain in right knee: Secondary | ICD-10-CM

## 2013-02-27 NOTE — Progress Notes (Signed)
  Subjective:    Patient ID: Kendra Simon, female    DOB: November 15, 1955, 57 y.o.   MRN: [id number]  HPI Patient comes in today for followup on right hip and right knee pain. Both have improved. Right hip pain improved with a second diagnostic/therapeutic intra-articular cortisone injection. She is still having intermittent groin pain but it is tolerable. Knee pain has also improved after cortisone injection but she still getting a feeling of it "wanting to come out". She's not noticed any swelling. No real mechanical symptoms. She has started working out again at [employer].     Review of Systems     Objective:   Physical Exam Well-developed, well-nourished. No acute distress   Right hip: Still some slight pain with internal rotation but not marked. No tenderness over the greater trochanteric bursa. Right knee: Full range of motion without an effusion. 1+ patellofemoral crepitus with a laterally tracking patella. There is weakness in her quads. She is tender to palpation along the medial joint line but a negative McMurray's. No tenderness along lateral joint line. Knee remained stable to ligamentous exam. Neurovascularly intact distally. Walking with a slight limp.       Assessment & Plan:  1. Improved right hip pain secondary to DJD 2. Improved right knee pain likely secondary to DJD versus degenerative meniscal tear 3. Multiple sclerosis  I've referred the patient to physical therapy for generalized hip and knee strengthening. She can wean to a home exercise program per the therapist's discretion. Want to get x-rays of her right knee to evaluate degree of arthritis present. Her neurologist has moved to [facility] and she is going to try to get a followup appointment with him sometime in July. I've asked her to followup with me just prior to seeing him. Call with questions or concerns in the interim.

## 2013-08-06 ENCOUNTER — Ambulatory Visit: Payer: BC Managed Care – PPO | Admitting: Sports Medicine

## 2013-08-24 ENCOUNTER — Encounter: Payer: Self-pay | Admitting: Sports Medicine

## 2013-08-24 ENCOUNTER — Ambulatory Visit (INDEPENDENT_AMBULATORY_CARE_PROVIDER_SITE_OTHER): Payer: BC Managed Care – PPO | Admitting: Sports Medicine

## 2013-08-24 VITALS — BP 126/85 | Ht 62.0 in | Wt 183.0 lb

## 2013-08-24 DIAGNOSIS — M25569 Pain in unspecified knee: Secondary | ICD-10-CM

## 2013-08-24 DIAGNOSIS — M25561 Pain in right knee: Secondary | ICD-10-CM

## 2013-08-24 MED ORDER — TRAMADOL HCL 50 MG PO TABS: 50.0000 mg | ORAL_TABLET | Freq: Two times a day (BID) | ORAL | Status: DC | PRN

## 2013-08-24 NOTE — Progress Notes (Signed)
  Subjective:    Patient ID: Kendra Simon, female    DOB: 09-Jan-1956, 57 y.o.   MRN: [id number]  HPI Patient comes in today with returning right knee pain. X-rays done back in May showed a paucity of degenerative changes. An intra-articular cortisone injection provided her with little symptom relief. She describes anterior knee pain which is present mainly with going from a seated to standing position and with walking. She gets intermittent swelling. Occasional catching and popping. She has also undergone an intra-articular cortisone injection into her right hip and has noticed good pain relief with this. X-rays of her right hip showed moderate degenerative changes. She takes over-the-counter ibuprofen as needed for pain but it does not seem to be very helpful.    Review of Systems     Objective:   Physical Exam Well-developed, no acute distress  Right knee: Full range of motion. Trace effusion. Positive patellar compression test with tethering of the patella laterally. She is tender to palpation along both medial and lateral joint lines with pain but no popping with McMurray's. Knee is grossly stable to ligamentous exam. Neurovascularly intact distally. Walking with a slight limp.       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 In the meantime I recommended that she try over-the-counter Aleve in lieu of ibuprofen and I will call in a prescription for tramadol for her to take for pain as well. I will call her with the results of the MRI once available at which point we will delineate further treatment.

## 2013-08-24 NOTE — Progress Notes (Signed)
  Subjective:    Patient ID: Kendra Simon, female    DOB: [birth year], 57 y.o.   MRN: [id number]  HPI    Review of Systems     Objective:   Physical Exam        Assessment & Plan:

## 2013-08-28 ENCOUNTER — Ambulatory Visit
Admission: RE | Admit: 2013-08-28 | Discharge: 2013-08-28 | Disposition: A | Discharge status: Home / Self Care | Payer: BC Managed Care – PPO | Source: Ambulatory Visit | Attending: Sports Medicine | Admitting: Sports Medicine

## 2013-08-28 DIAGNOSIS — M25561 Pain in right knee: Secondary | ICD-10-CM

## 2013-09-11 ENCOUNTER — Ambulatory Visit (INDEPENDENT_AMBULATORY_CARE_PROVIDER_SITE_OTHER): Payer: BC Managed Care – PPO | Admitting: Family Medicine

## 2013-09-11 ENCOUNTER — Encounter: Payer: Self-pay | Admitting: Family Medicine

## 2013-09-11 VITALS — BP 134/83 | HR 73 | Ht 62.0 in | Wt 183.0 lb

## 2013-09-11 DIAGNOSIS — M76899 Other specified enthesopathies of unspecified lower limb, excluding foot: Secondary | ICD-10-CM

## 2013-09-11 DIAGNOSIS — M6580 Other synovitis and tenosynovitis, unspecified site: Secondary | ICD-10-CM

## 2013-09-11 NOTE — Progress Notes (Signed)
   Subjective:    Patient ID: Kendra Simon, female    DOB: 10/25/1955, 57 y.o.   MRN: [id number]  HPI Patient comes in today at my request to go over her MRI findings of the right knee. There is no evidence of internal derangement. There is mild to moderate tendinosis of the distal quadriceps tendon at its insertion without an obvious tear. She continues to localize her pain along the medial knee but it has begun to improve.    Review of Systems     Objective:   Physical Exam Well-developed, no acute distress  Right knee: Full range of motion. No effusion. There is tenderness to palpation along the quadriceps tendon at the insertion onto the patella. Negative McMurray's. Knee is stable to ligamentous exam. Neurovascularly intact distally. Walking with a slight limp.  MSK ultrasound of the right knee: Limited images of the quadriceps tendon were obtained. There are hypoechoic changes in the distal tendon at the insertion onto the patella. No underlying effusion.       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 She is also reassured that this is not related to her MS. I've educated her on eccentric exercises for the quadriceps tendon (decline squats). She will perform these daily and followup with me in 4 weeks. I will repeat her ultrasound at that time.

## 2014-02-03 ENCOUNTER — Ambulatory Visit
Admission: RE | Admit: 2014-02-03 | Discharge: 2014-02-03 | Disposition: A | Discharge status: Home / Self Care | Payer: BC Managed Care – PPO | Source: Ambulatory Visit | Attending: Sports Medicine | Admitting: Sports Medicine

## 2014-02-03 ENCOUNTER — Encounter: Payer: Self-pay | Admitting: Sports Medicine

## 2014-02-03 ENCOUNTER — Ambulatory Visit (INDEPENDENT_AMBULATORY_CARE_PROVIDER_SITE_OTHER): Payer: BC Managed Care – PPO | Admitting: Sports Medicine

## 2014-02-03 VITALS — BP 137/87 | HR 83 | Ht 62.0 in | Wt 183.0 lb

## 2014-02-03 DIAGNOSIS — M25551 Pain in right hip: Secondary | ICD-10-CM

## 2014-02-03 DIAGNOSIS — M169 Osteoarthritis of hip, unspecified: Secondary | ICD-10-CM

## 2014-02-03 DIAGNOSIS — M25559 Pain in unspecified hip: Secondary | ICD-10-CM

## 2014-02-03 DIAGNOSIS — M1610 Unilateral primary osteoarthritis, unspecified hip: Secondary | ICD-10-CM

## 2014-02-03 MED ORDER — TRAMADOL HCL 50 MG PO TABS: 50.0000 mg | ORAL_TABLET | Freq: Two times a day (BID) | ORAL | Status: DC | PRN

## 2014-02-03 NOTE — Progress Notes (Signed)
   Subjective:    Patient ID: Kendra Simon, female    DOB: [birth year], 58 y.o.   MRN: [id number]  HPI Patient comes in today with returning right hip and thigh pain. She has a documented history of right hip osteoarthritis. She was last seen for this same problem about a year ago. Intra-articular cortisone injection into the right hip provided her with good symptom relief up until about 8 weeks ago. Pain has now returned and is similar in nature to what she's experienced previously. No trauma. She localizes the pain to the right groin with radiating pain down the right thigh to the knee. No associated numbness or tingling. She usually takes 800 mg of ibuprofen which typically helps but has not been very beneficial for the past month or so. She denies any swelling. No pain in the left hip.  Interim medical history is reviewed. History is significant for multiple sclerosis Medications are updated. No known drug allergies    Review of Systems     Objective:   Physical Exam Well-developed, well-nourished. No acute distress. Awake alert and oriented x3. Vital signs reviewed.  Right hip: Markedly decreased internal range of motion passively. I am only able to get her to about 10 of internal rotation. External rotation is to about 40. There is pain with both passive and active internal range of motion. Pain with resisted hip flexion. Examination of the knee shows full motion with no effusion. Patient is walking with a noticeable limp in the right foot and externally rotated position.  X-rays of the right hip including an AP pelvis and lateral right hip are obtained and compared to x-rays from January 2014. Overall, there has been some progression of her right hip DJD. It is now moderately advanced with joint space narrowing and sclerosis. Nothing acute.       Assessment & Plan:  1. Returning right hip pain secondary to moderately advanced right hip DJD 2. Multiple sclerosis  Patient has  had good success with intra-articular cortisone injections in the past. Therefore, I will refer her back over to [facility] imaging to have this repeated. I did explain to the patient that at some point she may be faced with the need for a total hip arthroplasty. I've given her a prescription for Ultram to take 50 mg twice daily as needed for pain. She can take this in addition to her ibuprofen. Followup when necessary.

## 2014-02-04 ENCOUNTER — Other Ambulatory Visit: Payer: Self-pay | Admitting: Sports Medicine

## 2014-02-04 ENCOUNTER — Other Ambulatory Visit: Payer: Self-pay | Admitting: *Deleted

## 2014-02-04 DIAGNOSIS — M25551 Pain in right hip: Secondary | ICD-10-CM

## 2014-02-05 ENCOUNTER — Ambulatory Visit
Admission: RE | Admit: 2014-02-05 | Discharge: 2014-02-05 | Disposition: A | Discharge status: Home / Self Care | Payer: BC Managed Care – PPO | Source: Ambulatory Visit | Attending: Sports Medicine | Admitting: Sports Medicine

## 2014-02-05 DIAGNOSIS — M25551 Pain in right hip: Secondary | ICD-10-CM

## 2014-02-05 MED ORDER — IOHEXOL 180 MG/ML  SOLN
1.0000 mL | Freq: Once | INTRAMUSCULAR | Status: AC | PRN
  Administered 2014-02-05: 1 mL via INTRA_ARTICULAR

## 2014-02-05 MED ORDER — METHYLPREDNISOLONE ACETATE 40 MG/ML INJ SUSP (RADIOLOG
120.0000 mg | Freq: Once | INTRAMUSCULAR | Status: AC
  Administered 2014-02-05: 120 mg via INTRA_ARTICULAR

## 2014-02-10 ENCOUNTER — Ambulatory Visit: Payer: BC Managed Care – PPO | Admitting: Sports Medicine

## 2014-02-12 ENCOUNTER — Telehealth: Payer: Self-pay | Admitting: Sports Medicine

## 2014-02-12 NOTE — Telephone Encounter (Signed)
Message copied by Ralene Cork on Fri Feb 12, 2014 12:06 PM ------      Message from: Claiborne Billings      Created: Fri Feb 12, 2014 11:03 AM      Contact: [phone]       Not feeling better after hip injection last week.  Still has pain. ------

## 2014-02-12 NOTE — Telephone Encounter (Signed)
I left a message on the patient's voicemail today after receiving a message from her that her right hip is still bothering her after her intra-articular cortisone injection last week. She has x-ray findings of advanced DJD in this right hip and unfortunately I think her treatment options are limited at this point in time. Therefore, I have recommended a referral to Dr. Dion Saucier at [facility] to discuss further options. I will go ahead and set up that referral with further workup and treatment per Dr. Shelba Flake discretion.

## 2014-09-08 ENCOUNTER — Other Ambulatory Visit: Payer: Self-pay | Admitting: Psychiatry

## 2014-09-08 DIAGNOSIS — G35 Multiple sclerosis: Secondary | ICD-10-CM

## 2014-09-13 ENCOUNTER — Other Ambulatory Visit: Payer: BC Managed Care – PPO

## 2014-09-20 ENCOUNTER — Ambulatory Visit
Admission: RE | Admit: 2014-09-20 | Discharge: 2014-09-20 | Disposition: A | Discharge status: Home / Self Care | Payer: BC Managed Care – PPO | Source: Ambulatory Visit | Attending: Psychiatry | Admitting: Psychiatry

## 2014-09-20 DIAGNOSIS — G35 Multiple sclerosis: Secondary | ICD-10-CM

## 2014-09-20 MED ORDER — GADOBENATE DIMEGLUMINE 529 MG/ML IV SOLN
16.0000 mL | Freq: Once | INTRAVENOUS | Status: AC | PRN
  Administered 2014-09-20: 16 mL via INTRAVENOUS

## 2014-11-21 ENCOUNTER — Emergency Department (HOSPITAL_COMMUNITY): Payer: BLUE CROSS/BLUE SHIELD

## 2014-11-21 ENCOUNTER — Encounter (HOSPITAL_COMMUNITY): Payer: Self-pay | Admitting: Emergency Medicine

## 2014-11-21 ENCOUNTER — Emergency Department (HOSPITAL_COMMUNITY)
Admission: EM | Admit: 2014-11-21 | Discharge: 2014-11-21 | Disposition: A | Discharge status: Home / Self Care | Payer: BLUE CROSS/BLUE SHIELD | Attending: Emergency Medicine | Admitting: Emergency Medicine

## 2014-11-21 DIAGNOSIS — Z8673 Personal history of transient ischemic attack (TIA), and cerebral infarction without residual deficits: Secondary | ICD-10-CM | POA: Diagnosis not present

## 2014-11-21 DIAGNOSIS — E876 Hypokalemia: Secondary | ICD-10-CM | POA: Insufficient documentation

## 2014-11-21 DIAGNOSIS — Z872 Personal history of diseases of the skin and subcutaneous tissue: Secondary | ICD-10-CM | POA: Diagnosis not present

## 2014-11-21 DIAGNOSIS — I10 Essential (primary) hypertension: Secondary | ICD-10-CM | POA: Insufficient documentation

## 2014-11-21 DIAGNOSIS — E039 Hypothyroidism, unspecified: Secondary | ICD-10-CM | POA: Insufficient documentation

## 2014-11-21 DIAGNOSIS — E780 Pure hypercholesterolemia: Secondary | ICD-10-CM | POA: Insufficient documentation

## 2014-11-21 DIAGNOSIS — Z79899 Other long term (current) drug therapy: Secondary | ICD-10-CM | POA: Diagnosis not present

## 2014-11-21 DIAGNOSIS — R079 Chest pain, unspecified: Secondary | ICD-10-CM

## 2014-11-21 DIAGNOSIS — G35 Multiple sclerosis: Secondary | ICD-10-CM | POA: Insufficient documentation

## 2014-11-21 LAB — BASIC METABOLIC PANEL
Anion gap: 10 (ref 5–15)
BUN: 15 mg/dL (ref 6–23)
CALCIUM: 9.9 mg/dL (ref 8.4–10.5)
CO2: 27 mmol/L (ref 19–32)
CREATININE: 0.84 mg/dL (ref 0.50–1.10)
Chloride: 101 mmol/L (ref 96–112)
GFR, EST AFRICAN AMERICAN: 88 mL/min — AB (ref >90)
GFR, EST NON AFRICAN AMERICAN: 76 mL/min — AB (ref >90)
GLUCOSE: 153 mg/dL — AB (ref 70–99)
POTASSIUM: 3.4 mmol/L — AB (ref 3.5–5.1)
Sodium: 138 mmol/L (ref 135–145)

## 2014-11-21 LAB — I-STAT TROPONIN, ED: TROPONIN I, POC: 0 ng/mL (ref 0.00–0.08)

## 2014-11-21 LAB — CBC
HCT: 36.4 % (ref 36.0–46.0)
Hemoglobin: 12.2 g/dL (ref 12.0–15.0)
MCH: 30.6 pg (ref 26.0–34.0)
MCHC: 33.5 g/dL (ref 30.0–36.0)
MCV: 91.2 fL (ref 78.0–100.0)
Platelets: 324 10*3/uL (ref 150–400)
RBC: 3.99 MIL/uL (ref 3.87–5.11)
RDW: 12.7 % (ref 11.5–15.5)
WBC: 9.6 10*3/uL (ref 4.0–10.5)

## 2014-11-21 MED ORDER — IOHEXOL 350 MG/ML SOLN
100.0000 mL | Freq: Once | INTRAVENOUS | Status: AC | PRN
  Administered 2014-11-21: 100 mL via INTRAVENOUS

## 2014-11-21 NOTE — ED Notes (Signed)
Patient transported to CT 

## 2014-11-21 NOTE — ED Provider Notes (Signed)
CSN: [id number]     Arrival date & time 11/21/14  0820 History   First MD Initiated Contact with Patient 11/21/14 0831     Chief Complaint  Patient presents with  . Chest Pain     (Consider location/radiation/quality/duration/timing/severity/associated sxs/prior Treatment) HPI  Kendra Simon is a 59 y.o. female with PMH of MS, hypertension, hypercholesterolemia, TIA presenting with ongoing chest pain for 2 weeks that is intermittent and occurs at rest and with activity. She had acute worsening of this 2 days ago and saw her primary care provider Dr. August Saucer who did lab work. She was called today for she had elevated d-dimer and presented here for further evaluation. Patient denies any current shortness of breath. She endorses nausea but no vomiting or diaphoresis. Patient is not have a cardiac history. She denies any cardiac catheterization but has had a stress test in the past year which she reports was normal. She denies any history of malignancy, DVT, PE, unilateral leg swelling, vaginal estrogen, hemoptysis, recent surgery or trauma.   Past Medical History  Diagnosis Date  . Essential hypertension, malignant   . Hypopotassemia   . Multiple sclerosis   . Pure hypercholesterolemia   . Unspecified hypothyroidism   . Unspecified pruritic disorder   . TIA (transient ischemic attack)   . OSA (obstructive sleep apnea) 02/17/2012         Past Surgical History  Procedure Laterality Date  . Partial hysterectomy     Family History  Problem Relation Age of Onset  . Emphysema Maternal Grandfather    History  Substance Use Topics  . Smoking status: Never Smoker   . Smokeless tobacco: Never Used  . Alcohol Use: Yes     Comment: ONCE A MONTH    OB History    No data available     Review of Systems 10 Systems reviewed and are negative for acute change except as noted in the HPI.    Allergies  Review of patient's allergies indicates no known allergies.  Home Medications    Prior to Admission medications   Medication Sig Start Date End Date Taking? Authorizing Provider  ACIPHEX 20 MG tablet Take 20 mg by mouth daily.  02/22/12  Yes Historical Provider, MD  ALPRAZolam Prudy Feeler) 0.5 MG tablet Take 0.5 mg by mouth 4 (four) times daily. Take every day per patient 02/06/12  Yes Historical Provider, MD  amLODipine (NORVASC) 2.5 MG tablet Take 2.5 mg by mouth daily.  02/07/12  Yes Historical Provider, MD  carbamazepine (CARBATROL) 300 MG 12 hr capsule Take 300 mg by mouth daily as needed. For seizure 01/10/14  Yes Historical Provider, MD  Gabapentin, PHN, 300 MG TABS Take 1 tablet by mouth 3 (three) times daily.    Yes Historical Provider, MD  ibuprofen (ADVIL,MOTRIN) 800 MG tablet Take 800 mg by mouth every 6 (six) hours as needed for moderate pain.  01/30/14  Yes Historical Provider, MD  levothyroxine (SYNTHROID, LEVOTHROID) 50 MCG tablet Take 50 mcg by mouth daily.  02/18/12  Yes Historical Provider, MD  methylphenidate (RITALIN) 20 MG tablet Take 20 mg by mouth daily as needed. For tensioin span 01/22/12  Yes Historical Provider, MD  montelukast (SINGULAIR) 10 MG tablet Take 10 mg by mouth daily. 08/26/13  Yes Historical Provider, MD  potassium chloride SA (K-DUR,KLOR-CON) 20 MEQ tablet Take 20 mEq by mouth daily. 01/01/14  Yes Historical Provider, MD  PROZAC 20 MG capsule Take 20 mg by mouth daily.  01/03/12  Yes Historical  Provider, MD  simvastatin (ZOCOR) 20 MG tablet 20 mg daily.  02/12/12  Yes Historical Provider, MD  TECFIDERA 240 MG CPDR Take 240 mg by mouth daily. 09/07/13  Yes Historical Provider, MD  triamterene-hydrochlorothiazide (MAXZIDE-25) 37.5-25 MG per tablet Take 1 tablet by mouth daily.  02/06/12  Yes Historical Provider, MD  traMADol (ULTRAM) 50 MG tablet Take 1 tablet (50 mg total) by mouth 2 (two) times daily as needed. Patient not taking: Reported on 11/21/2014 02/03/14   Ozzie Hoyle Draper, DO   BP 121/78 mmHg  Pulse 68  Temp(Src) 98.3 F (36.8 C) (Oral)  Resp  18  Ht 5\' 2"  (1.575 m)  Wt 182 lb (82.555 kg)  BMI 33.28 kg/m2  SpO2 99% Physical Exam  Constitutional: She appears well-developed and well-nourished. No distress.  HENT:  Head: Normocephalic and atraumatic.  Eyes: Conjunctivae and EOM are normal. Right eye exhibits no discharge. Left eye exhibits no discharge.  Neck: No JVD present.  Cardiovascular: Normal rate, regular rhythm and normal heart sounds.   No peripheral edema or tenderness. No calf tenderness.  Pulmonary/Chest: Effort normal and breath sounds normal. No respiratory distress. She has no wheezes.  Abdominal: Soft. Bowel sounds are normal. She exhibits no distension. There is no tenderness.  Neurological: She is alert. She exhibits normal muscle tone. Coordination normal.  Skin: Skin is warm and dry. She is not diaphoretic.  Nursing note and vitals reviewed.   ED Course  Procedures (including critical care time) Labs Review Labs Reviewed  BASIC METABOLIC PANEL - Abnormal; Notable for the following:    Potassium 3.4 (*)    Glucose, Bld 153 (*)    GFR calc non Af Amer 76 (*)    GFR calc Af Amer 88 (*)    All other components within normal limits  CBC  I-STAT TROPOININ, ED    Imaging Review Ct Angio Chest Pe W/cm &/or Wo Cm  11/21/2014   CLINICAL DATA:  Chest pain and elevated D-dimer.  EXAM: CT ANGIOGRAPHY CHEST WITH CONTRAST  TECHNIQUE: Multidetector CT imaging of the chest was performed using the standard protocol during bolus administration of intravenous contrast. Multiplanar CT image reconstructions and MIPs were obtained to evaluate the vascular anatomy.  CONTRAST:  OMNIPAQUE IOHEXOL 350 MG/ML SOLN  COMPARISON:  None.  FINDINGS: Mediastinum/Lymph Nodes: Satisfactory opacification of pulmonary arteries is noted, and no pulmonary embolism identified. No masses or pathologically enlarged lymph nodes identified. Mild cardiomegaly noted.  Lungs/Pleura: No pulmonary infiltrate or mass identified. No effusion  present.  Musculoskeletal/Soft Tissues: No suspicious bone lesions or other significant chest wall abnormality.  Upper Abdomen:  Unremarkable.  Review of the MIP images confirms the above findings.  IMPRESSION: Negative. No evidence of pulmonary embolism or other active disease within the thorax.   Electronically Signed   By: Myles Rosenthal M.D.   On: 11/21/2014 11:11     EKG Interpretation   Date/Time:  Sunday November 21 2014 08:24:53 EST Ventricular Rate:  69 PR Interval:  130 QRS Duration: 64 QT Interval:  410 QTC Calculation: 439 R Axis:   52 Text Interpretation:  Normal sinus rhythm Normal ECG No significant change  since last tracing Confirmed by KNAPP  MD-J, JON ([id number]) on 11/21/2014  8:40:41 AM      MDM   Final diagnoses:  Chest pain   Patient presenting with intermittent sharp chest pain for last 2 weeks with acute worsening 2 days ago. Primary care evaluated and d-dimer was elevated. VSS. RRR and  CTAB. No JVD or peripheral edema. Pt low risk heart score. With elevated d dimer and persistent chest pain, CT angio ordered and negative for PE. EKG without significant changes. Troponin negative. Pt with mild hypokalemia, 3.4 but I doubt its related to her symptoms. Patient is afebrile, nontoxic, and in no acute distress. Patient is appropriate for outpatient management and is stable for discharge. Follow up with PCP in 2 days.  Discussed return precautions with patient. Discussed all results and patient verbalizes understanding and agrees with plan.  Case has been discussed with Dr. Lynelle Doctor who agrees with the above plan and to discharge.     Louann Sjogren, PA-C 11/21/14 1541  Linwood Dibbles, MD 11/21/14 [phone]

## 2014-11-21 NOTE — ED Notes (Signed)
Pt sent from Dr August Saucer with c/o chest pain ongoing for a couple weeks. D-dimer done by Dr August Saucer and was elevated.

## 2014-11-21 NOTE — Discharge Instructions (Signed)
Return to the emergency room with worsening of symptoms, new symptoms or with symptoms that are concerning , especially chest pain that feels like a pressure, spreads to left arm or jaw, worse with exertion, associated with nausea, vomiting, shortness of breath and/or sweating.  °Please call your doctor for a followup appointment within 24-48 hours. When you talk to your doctor please let them know that you were seen in the emergency department and have them acquire all of your records so that they can discuss the findings with you and formulate a treatment plan to fully care for your new and ongoing problems. °Read below information and follow recommendations. ° °Chest Pain (Nonspecific) °It is often hard to give a specific diagnosis for the cause of chest pain. There is always a chance that your pain could be related to something serious, such as a heart attack or a blood clot in the lungs. You need to follow up with your health care provider for further evaluation. °CAUSES  °· Heartburn. °· Pneumonia or bronchitis. °· Anxiety or stress. °· Inflammation around your heart (pericarditis) or lung (pleuritis or pleurisy). °· A blood clot in the lung. °· A collapsed lung (pneumothorax). It can develop suddenly on its own (spontaneous pneumothorax) or from trauma to the chest. °· Shingles infection (herpes zoster virus). °The chest wall is composed of bones, muscles, and cartilage. Any of these can be the source of the pain. °· The bones can be bruised by injury. °· The muscles or cartilage can be strained by coughing or overwork. °· The cartilage can be affected by inflammation and become sore (costochondritis). °DIAGNOSIS  °Lab tests or other studies may be needed to find the cause of your pain. Your health care provider may have you take a test called an ambulatory electrocardiogram (ECG). An ECG records your heartbeat patterns over a 24-hour period. You may also have other tests, such as: °· Transthoracic  echocardiogram (TTE). During echocardiography, sound waves are used to evaluate how blood flows through your heart. °· Transesophageal echocardiogram (TEE). °· Cardiac monitoring. This allows your health care provider to monitor your heart rate and rhythm in real time. °· Holter monitor. This is a portable device that records your heartbeat and can help diagnose heart arrhythmias. It allows your health care provider to track your heart activity for several days, if needed. °· Stress tests by exercise or by giving medicine that makes the heart beat faster. °TREATMENT  °· Treatment depends on what may be causing your chest pain. Treatment may include: °¨ Acid blockers for heartburn. °¨ Anti-inflammatory medicine. °¨ Pain medicine for inflammatory conditions. °¨ Antibiotics if an infection is present. °· You may be advised to change lifestyle habits. This includes stopping smoking and avoiding alcohol, caffeine, and chocolate. °· You may be advised to keep your head raised (elevated) when sleeping. This reduces the chance of acid going backward from your stomach into your esophagus. °Most of the time, nonspecific chest pain will improve within 2-3 days with rest and mild pain medicine.  °HOME CARE INSTRUCTIONS  °· If antibiotics were prescribed, take them as directed. Finish them even if you start to feel better. °· For the next few days, avoid physical activities that bring on chest pain. Continue physical activities as directed. °· Do not use any tobacco products, including cigarettes, chewing tobacco, or electronic cigarettes. °· Avoid drinking alcohol. °· Only take medicine as directed by your health care provider. °· Follow your health care provider's suggestions for further testing   if your chest pain does not go away. °· Keep any follow-up appointments you made. If you do not go to an appointment, you could develop lasting (chronic) problems with pain. If there is any problem keeping an appointment, call to  reschedule. °SEEK MEDICAL CARE IF:  °· Your chest pain does not go away, even after treatment. °· You have a rash with blisters on your chest. °· You have a fever. °SEEK IMMEDIATE MEDICAL CARE IF:  °· You have increased chest pain or pain that spreads to your arm, neck, jaw, back, or abdomen. °· You have shortness of breath. °· You have an increasing cough, or you cough up blood. °· You have severe back or abdominal pain. °· You feel nauseous or vomit. °· You have severe weakness. °· You faint. °· You have chills. °This is an emergency. Do not wait to see if the pain will go away. Get medical help at once. Call your local emergency services (911 in [place] Do not drive yourself to the hospital. °MAKE SURE YOU:  °· Understand these instructions. °· Will watch your condition. °· Will get help right away if you are not doing well or get worse. °Document Released: 07/04/2005 Document Revised: 09/29/2013 Document Reviewed: 04/29/2008 °ExitCare® Patient Information ©2015 [employer]. This information is not intended to replace advice given to you by your health care provider. Make sure you discuss any questions you have with your health care provider. ° ° °

## 2014-11-23 ENCOUNTER — Other Ambulatory Visit: Payer: Self-pay | Admitting: Internal Medicine

## 2014-11-23 DIAGNOSIS — R079 Chest pain, unspecified: Secondary | ICD-10-CM

## 2015-01-24 ENCOUNTER — Encounter (HOSPITAL_COMMUNITY)
Admission: RE | Admit: 2015-01-24 | Discharge: 2015-01-24 | Disposition: A | Discharge status: Home / Self Care | Payer: BLUE CROSS/BLUE SHIELD | Source: Ambulatory Visit | Attending: Psychiatry | Admitting: Psychiatry

## 2015-01-24 ENCOUNTER — Encounter (HOSPITAL_COMMUNITY): Payer: Self-pay

## 2015-01-24 DIAGNOSIS — G35 Multiple sclerosis: Secondary | ICD-10-CM | POA: Insufficient documentation

## 2015-01-24 MED ORDER — DIPHENHYDRAMINE HCL 50 MG/ML IJ SOLN
50.0000 mg | Freq: Every day | INTRAMUSCULAR | Status: DC
  Administered 2015-01-24: 50 mg via INTRAVENOUS
  Filled 2015-01-24: qty 1

## 2015-01-24 MED ORDER — METHYLPREDNISOLONE SODIUM SUCC 125 MG IJ SOLR
125.0000 mg | Freq: Every day | INTRAMUSCULAR | Status: DC
  Administered 2015-01-24: 125 mg via INTRAVENOUS
  Filled 2015-01-24 (×2): qty 2

## 2015-01-24 MED ORDER — SODIUM CHLORIDE 0.9 % IV SOLN
Freq: Every day | INTRAVENOUS | Status: DC
  Administered 2015-01-24: 250 mL via INTRAVENOUS

## 2015-01-24 MED ORDER — RITUXIMAB CHEMO INJECTION 500 MG/50ML
1000.0000 mg | Freq: Every day | INTRAVENOUS | Status: DC
  Administered 2015-01-24: 1000 mg via INTRAVENOUS
  Filled 2015-01-24 (×2): qty 100

## 2015-01-24 MED ORDER — ACETAMINOPHEN 325 MG PO TABS
650.0000 mg | ORAL_TABLET | Freq: Every day | ORAL | Status: DC
  Administered 2015-01-24: 650 mg via ORAL
  Filled 2015-01-24: qty 2

## 2015-01-24 NOTE — Discharge Instructions (Signed)
RITUXAN °Rituximab injection °What is this medicine? °RITUXIMAB (ri TUX i mab) is a monoclonal antibody. This medicine changes the way the body's immune system works. It is used commonly to treat non-Hodgkin's lymphoma and other conditions. In cancer cells, this drug targets a specific protein within cancer cells and stops the cancer cells from growing. It is also used to treat rhuematoid arthritis (RA). In RA, this medicine slow the inflammatory process and help reduce joint pain and swelling. This medicine is often used with other cancer or arthritis medications. °This medicine may be used for other purposes; ask your health care provider or pharmacist if you have questions. °COMMON BRAND NAME(S): Rituxan °What should I tell my health care provider before I take this medicine? °They need to know if you have any of these conditions: °-blood disorders °-heart disease °-history of hepatitis B °-infection (especially a virus infection such as chickenpox, cold sores, or herpes) °-irregular heartbeat °-kidney disease °-lung or breathing disease, like asthma °-lupus °-an unusual or allergic reaction to rituximab, mouse proteins, other medicines, foods, dyes, or preservatives °-pregnant or trying to get pregnant °-breast-feeding °How should I use this medicine? °This medicine is for infusion into a vein. It is administered in a hospital or clinic by a specially trained health care professional. °A special MedGuide will be given to you by the pharmacist with each prescription and refill. Be sure to read this information carefully each time. °Talk to your pediatrician regarding the use of this medicine in children. This medicine is not approved for use in children. °Overdosage: If you think you have taken too much of this medicine contact a poison control center or emergency room at once. °NOTE: This medicine is only for you. Do not share this medicine with others. °What if I miss a dose? °It is important not to miss a  dose. Call your doctor or health care professional if you are unable to keep an appointment. °What may interact with this medicine? °-cisplatin °-medicines for blood pressure °-some other medicines for arthritis °-vaccines °This list may not describe all possible interactions. Give your health care provider a list of all the medicines, herbs, non-prescription drugs, or dietary supplements you use. Also tell them if you smoke, drink alcohol, or use illegal drugs. Some items may interact with your medicine. °What should I watch for while using this medicine? °Report any side effects that you notice during your treatment right away, such as changes in your breathing, fever, chills, dizziness or lightheadedness. These effects are more common with the first dose. °Visit your prescriber or health care professional for checks on your progress. You will need to have regular blood work. Report any other side effects. The side effects of this medicine can continue after you finish your treatment. Continue your course of treatment even though you feel ill unless your doctor tells you to stop. °Call your doctor or health care professional for advice if you get a fever, chills or sore throat, or other symptoms of a cold or flu. Do not treat yourself. This drug decreases your body's ability to fight infections. Try to avoid being around people who are sick. °This medicine may increase your risk to bruise or bleed. Call your doctor or health care professional if you notice any unusual bleeding. °Be careful brushing and flossing your teeth or using a toothpick because you may get an infection or bleed more easily. If you have any dental work done, tell your dentist you are receiving this medicine. °Avoid taking   products that contain aspirin, acetaminophen, ibuprofen, naproxen, or ketoprofen unless instructed by your doctor. These medicines may hide a fever. °Do not become pregnant while taking this medicine. Women should inform  their doctor if they wish to become pregnant or think they might be pregnant. There is a potential for serious side effects to an unborn child. Talk to your health care professional or pharmacist for more information. Do not breast-feed an infant while taking this medicine. °What side effects may I notice from receiving this medicine? °Side effects that you should report to your doctor or health care professional as soon as possible: °-allergic reactions like skin rash, itching or hives, swelling of the face, lips, or tongue °-low blood counts - this medicine may decrease the number of white blood cells, red blood cells and platelets. You may be at increased risk for infections and bleeding. °-signs of infection - fever or chills, cough, sore throat, pain or difficulty passing urine °-signs of decreased platelets or bleeding - bruising, pinpoint red spots on the skin, black, tarry stools, blood in the urine °-signs of decreased red blood cells - unusually weak or tired, fainting spells, lightheadedness °-breathing problems °-confused, not responsive °-chest pain °-fast, irregular heartbeat °-feeling faint or lightheaded, falls °-mouth sores °-redness, blistering, peeling or loosening of the skin, including inside the mouth °-stomach pain °-swelling of the ankles, feet, or hands °-trouble passing urine or change in the amount of urine °Side effects that usually do not require medical attention (report to your doctor or other health care professional if they continue or are bothersome): °-anxiety °-headache °-loss of appetite °-muscle aches °-nausea °-night sweats °This list may not describe all possible side effects. Call your doctor for medical advice about side effects. You may report side effects to FDA at 1-800-FDA-1088. °Where should I keep my medicine? °This drug is given in a hospital or clinic and will not be stored at home. °NOTE: This sheet is a summary. It may not cover all possible information. If you  have questions about this medicine, talk to your doctor, pharmacist, or health care provider. °© 2015, [employer]. (2008-05-24 14:04:59) °Multiple Sclerosis °Multiple sclerosis (MS) is a disease of the central nervous system. It leads to the loss of the insulating covering of the nerves (myelin sheath) of your brain. When this happens, brain signals do not get sent properly or may not get sent at all. The age of onset of MS varies.  °CAUSES °The cause of MS is unknown. However, it is more common in the [place] than in the southern [place]. °RISK FACTORS °There is a higher number of women with MS than men. MS is not an illness that is passed down to you from your family members (inherited). However, your risk of MS is higher if you have a relative with MS. °SIGNS AND SYMPTOMS  °The symptoms of MS occur in episodes or attacks. These attacks may last weeks to months. There may be long periods of almost no symptoms between attacks. The symptoms of MS vary. This is because of the many different ways it affects the central nervous system. The main symptoms of MS include: °· Vision problems and eye pain. °· Numbness. °· Weakness. °· Inability to move your arms, hands, feet, or legs (paralysis). °· Balance problems. °· Tremors. °DIAGNOSIS  °Your health care provider can diagnose MS with the help of imaging exams and lab tests. These may include specialized X-ray exams and spinal fluid tests. The best imaging   exam to confirm a diagnosis of MS is an MRI. °TREATMENT  °There is no known cure for MS, but there are medicines that can decrease the number and frequency of attacks. Steroids are often used for short-term relief. Physical and occupational therapy may also help. There are also many new alternative or complementary treatments available to help control the symptoms of MS. Ask your health care provider if any of these other options are right for you. °HOME CARE INSTRUCTIONS  °· Take  medicines as directed by your health care provider. °· Exercise as directed by your health care provider. °SEEK MEDICAL CARE IF: °You begin to feel depressed. °SEEK IMMEDIATE MEDICAL CARE IF: °· You develop paralysis. °· You have problems with bladder, bowel, or sexual function. °· You develop mental changes, such as forgetfulness or mood swings. °· You have a period of uncontrolled movements (seizure). °Document Released: 09/21/2000 Document Revised: 09/29/2013 Document Reviewed: 06/01/2013 °ExitCare® Patient Information ©2015 [employer]. This information is not intended to replace advice given to you by your health care provider. Make sure you discuss any questions you have with your health care provider. ° ° °

## 2015-01-24 NOTE — Progress Notes (Signed)
Uneventful infusion of Rituxan #1/2 of this cycle. Pt is scheduled for her next infusion on 02/08/15.

## 2015-02-08 ENCOUNTER — Encounter (HOSPITAL_COMMUNITY)
Admission: RE | Admit: 2015-02-08 | Discharge: 2015-02-08 | Disposition: A | Discharge status: Home / Self Care | Payer: BLUE CROSS/BLUE SHIELD | Source: Ambulatory Visit | Attending: Psychiatry | Admitting: Psychiatry

## 2015-02-08 ENCOUNTER — Encounter (HOSPITAL_COMMUNITY): Payer: Self-pay

## 2015-02-08 DIAGNOSIS — G35 Multiple sclerosis: Secondary | ICD-10-CM | POA: Diagnosis not present

## 2015-02-08 MED ORDER — ACETAMINOPHEN 325 MG PO TABS
650.0000 mg | ORAL_TABLET | Freq: Every day | ORAL | Status: AC
  Administered 2015-02-08: 650 mg via ORAL
  Filled 2015-02-08: qty 2

## 2015-02-08 MED ORDER — SODIUM CHLORIDE 0.9 % IV SOLN
1000.0000 mg | Freq: Every day | INTRAVENOUS | Status: AC
  Administered 2015-02-08: 1000 mg via INTRAVENOUS
  Filled 2015-02-08: qty 100

## 2015-02-08 MED ORDER — METHYLPREDNISOLONE SODIUM SUCC 125 MG IJ SOLR
125.0000 mg | Freq: Every day | INTRAMUSCULAR | Status: AC
  Administered 2015-02-08: 125 mg via INTRAVENOUS
  Filled 2015-02-08: qty 2

## 2015-02-08 MED ORDER — DIPHENHYDRAMINE HCL 50 MG/ML IJ SOLN
50.0000 mg | Freq: Every day | INTRAMUSCULAR | Status: AC
  Administered 2015-02-08: 50 mg via INTRAVENOUS
  Filled 2015-02-08: qty 1

## 2015-02-08 MED ORDER — SODIUM CHLORIDE 0.9 % IV SOLN
Freq: Every day | INTRAVENOUS | Status: AC
  Administered 2015-02-08: 07:00:00 via INTRAVENOUS

## 2015-02-08 NOTE — Discharge Instructions (Signed)
Rituxan Rituximab injection What is this medicine? RITUXIMAB (ri TUX i mab) is a monoclonal antibody. This medicine changes the way the body's immune system works. It is used commonly to treat non-Hodgkin's lymphoma and other conditions. In cancer cells, this drug targets a specific protein within cancer cells and stops the cancer cells from growing. It is also used to treat rhuematoid arthritis (RA). In RA, this medicine slow the inflammatory process and help reduce joint pain and swelling. This medicine is often used with other cancer or arthritis medications. This medicine may be used for other purposes; ask your health care provider or pharmacist if you have questions. COMMON BRAND NAME(S): Rituxan What should I tell my health care provider before I take this medicine? They need to know if you have any of these conditions: -blood disorders -heart disease -history of hepatitis B -infection (especially a virus infection such as chickenpox, cold sores, or herpes) -irregular heartbeat -kidney disease -lung or breathing disease, like asthma -lupus -an unusual or allergic reaction to rituximab, mouse proteins, other medicines, foods, dyes, or preservatives -pregnant or trying to get pregnant -breast-feeding How should I use this medicine? This medicine is for infusion into a vein. It is administered in a hospital or clinic by a specially trained health care professional. A special MedGuide will be given to you by the pharmacist with each prescription and refill. Be sure to read this information carefully each time. Talk to your pediatrician regarding the use of this medicine in children. This medicine is not approved for use in children. Overdosage: If you think you have taken too much of this medicine contact a poison control center or emergency room at once. NOTE: This medicine is only for you. Do not share this medicine with others. What if I miss a dose? It is important not to miss a  dose. Call your doctor or health care professional if you are unable to keep an appointment. What may interact with this medicine? -cisplatin -medicines for blood pressure -some other medicines for arthritis -vaccines This list may not describe all possible interactions. Give your health care provider a list of all the medicines, herbs, non-prescription drugs, or dietary supplements you use. Also tell them if you smoke, drink alcohol, or use illegal drugs. Some items may interact with your medicine. What should I watch for while using this medicine? Report any side effects that you notice during your treatment right away, such as changes in your breathing, fever, chills, dizziness or lightheadedness. These effects are more common with the first dose. Visit your prescriber or health care professional for checks on your progress. You will need to have regular blood work. Report any other side effects. The side effects of this medicine can continue after you finish your treatment. Continue your course of treatment even though you feel ill unless your doctor tells you to stop. Call your doctor or health care professional for advice if you get a fever, chills or sore throat, or other symptoms of a cold or flu. Do not treat yourself. This drug decreases your body's ability to fight infections. Try to avoid being around people who are sick. This medicine may increase your risk to bruise or bleed. Call your doctor or health care professional if you notice any unusual bleeding. Be careful brushing and flossing your teeth or using a toothpick because you may get an infection or bleed more easily. If you have any dental work done, tell your dentist you are receiving this medicine. Avoid taking  products that contain aspirin, acetaminophen, ibuprofen, naproxen, or ketoprofen unless instructed by your doctor. These medicines may hide a fever. Do not become pregnant while taking this medicine. Women should inform  their doctor if they wish to become pregnant or think they might be pregnant. There is a potential for serious side effects to an unborn child. Talk to your health care professional or pharmacist for more information. Do not breast-feed an infant while taking this medicine. What side effects may I notice from receiving this medicine? Side effects that you should report to your doctor or health care professional as soon as possible: -allergic reactions like skin rash, itching or hives, swelling of the face, lips, or tongue -low blood counts - this medicine may decrease the number of white blood cells, red blood cells and platelets. You may be at increased risk for infections and bleeding. -signs of infection - fever or chills, cough, sore throat, pain or difficulty passing urine -signs of decreased platelets or bleeding - bruising, pinpoint red spots on the skin, black, tarry stools, blood in the urine -signs of decreased red blood cells - unusually weak or tired, fainting spells, lightheadedness -breathing problems -confused, not responsive -chest pain -fast, irregular heartbeat -feeling faint or lightheaded, falls -mouth sores -redness, blistering, peeling or loosening of the skin, including inside the mouth -stomach pain -swelling of the ankles, feet, or hands -trouble passing urine or change in the amount of urine Side effects that usually do not require medical attention (report to your doctor or other health care professional if they continue or are bothersome): -anxiety -headache -loss of appetite -muscle aches -nausea -night sweats This list may not describe all possible side effects. Call your doctor for medical advice about side effects. You may report side effects to FDA at 1-800-FDA-1088. Where should I keep my medicine? This drug is given in a hospital or clinic and will not be stored at home. NOTE: This sheet is a summary. It may not cover all possible information. If you  have questions about this medicine, talk to your doctor, pharmacist, or health care provider.  2015, [employer] Standard. (2008-05-24 14:04:59)

## 2015-05-23 ENCOUNTER — Emergency Department (HOSPITAL_COMMUNITY): Payer: BLUE CROSS/BLUE SHIELD

## 2015-05-23 ENCOUNTER — Encounter (HOSPITAL_COMMUNITY): Payer: Self-pay | Admitting: *Deleted

## 2015-05-23 ENCOUNTER — Emergency Department (HOSPITAL_COMMUNITY)
Admission: EM | Admit: 2015-05-23 | Discharge: 2015-05-23 | Disposition: A | Discharge status: Home / Self Care | Payer: BLUE CROSS/BLUE SHIELD | Attending: Emergency Medicine | Admitting: Emergency Medicine

## 2015-05-23 DIAGNOSIS — W1839XA Other fall on same level, initial encounter: Secondary | ICD-10-CM | POA: Diagnosis not present

## 2015-05-23 DIAGNOSIS — M79651 Pain in right thigh: Secondary | ICD-10-CM

## 2015-05-23 DIAGNOSIS — Y998 Other external cause status: Secondary | ICD-10-CM | POA: Insufficient documentation

## 2015-05-23 DIAGNOSIS — W19XXXA Unspecified fall, initial encounter: Secondary | ICD-10-CM

## 2015-05-23 DIAGNOSIS — Y9389 Activity, other specified: Secondary | ICD-10-CM | POA: Insufficient documentation

## 2015-05-23 DIAGNOSIS — S8391XA Sprain of unspecified site of right knee, initial encounter: Secondary | ICD-10-CM | POA: Insufficient documentation

## 2015-05-23 DIAGNOSIS — E039 Hypothyroidism, unspecified: Secondary | ICD-10-CM | POA: Insufficient documentation

## 2015-05-23 DIAGNOSIS — Z8673 Personal history of transient ischemic attack (TIA), and cerebral infarction without residual deficits: Secondary | ICD-10-CM | POA: Insufficient documentation

## 2015-05-23 DIAGNOSIS — S3992XA Unspecified injury of lower back, initial encounter: Secondary | ICD-10-CM | POA: Diagnosis not present

## 2015-05-23 DIAGNOSIS — I10 Essential (primary) hypertension: Secondary | ICD-10-CM | POA: Diagnosis not present

## 2015-05-23 DIAGNOSIS — S73101A Unspecified sprain of right hip, initial encounter: Secondary | ICD-10-CM | POA: Diagnosis not present

## 2015-05-23 DIAGNOSIS — Y9289 Other specified places as the place of occurrence of the external cause: Secondary | ICD-10-CM | POA: Diagnosis not present

## 2015-05-23 DIAGNOSIS — Z79899 Other long term (current) drug therapy: Secondary | ICD-10-CM | POA: Diagnosis not present

## 2015-05-23 DIAGNOSIS — S79921A Unspecified injury of right thigh, initial encounter: Secondary | ICD-10-CM | POA: Diagnosis present

## 2015-05-23 DIAGNOSIS — Z8669 Personal history of other diseases of the nervous system and sense organs: Secondary | ICD-10-CM | POA: Diagnosis not present

## 2015-05-23 NOTE — ED Notes (Signed)
Pt reports falling on Saturday, having pain to lower back, right hip and down right leg to her knee. Ambulatory at triage with cane.

## 2015-05-23 NOTE — ED Provider Notes (Signed)
CSN: [id number]     Arrival date & time 05/23/15  1219 History  This chart was scribed for Zadie Rhine, MD by Leone Payor, ED Scribe. This patient was seen in room TR07C/TR07C and the patient's care was started 2:00 PM.    Chief Complaint  Patient presents with  . Leg Pain  . Back Pain    The history is provided by the patient and the spouse. No language interpreter was used.     HPI Comments: Kendra Simon is a 59 y.o. female with past medical history of MS who presents to the Emergency Department complaining of 2 days of constant, unchanged right lower back pain with associated right thigh and knee pain. She reports having a mechanical fall 2 days ago while standing from the couch. She denies head injury or LOC. She reports ability to walk with a cane or walker but states she continues to have pain with bearing weight on the right side. She has been taking ibuprofen 800 mg and already prescribed Gabapentin with mild relief. Spouse states patient has a history of right leg pain related to MS. She denies neck pain, weakness, numbness, bowel or bladder incontinence.   Past Medical History  Diagnosis Date  . Essential hypertension, malignant   . Hypopotassemia   . Multiple sclerosis   . Pure hypercholesterolemia   . Unspecified hypothyroidism   . Unspecified pruritic disorder   . TIA (transient ischemic attack)   . OSA (obstructive sleep apnea) 02/17/2012         Past Surgical History  Procedure Laterality Date  . Partial hysterectomy     Family History  Problem Relation Age of Onset  . Emphysema Maternal Grandfather    Social History  Substance Use Topics  . Smoking status: Never Smoker   . Smokeless tobacco: Never Used  . Alcohol Use: Yes     Comment: ONCE A MONTH    OB History    No data available     Review of Systems  Musculoskeletal: Positive for back pain and arthralgias. Negative for neck pain.  Neurological: Negative for weakness and numbness.     Allergies  Review of patient's allergies indicates no known allergies.  Home Medications   Prior to Admission medications   Medication Sig Start Date End Date Taking? Authorizing Provider  ACIPHEX 20 MG tablet Take 20 mg by mouth daily.  02/22/12   Historical Provider, MD  ALPRAZolam Prudy Feeler) 0.5 MG tablet Take 0.5 mg by mouth 4 (four) times daily. Take every day per patient 02/06/12   Historical Provider, MD  amLODipine (NORVASC) 2.5 MG tablet Take 2.5 mg by mouth daily.  02/07/12   Historical Provider, MD  carbamazepine (CARBATROL) 300 MG 12 hr capsule Take 300 mg by mouth daily as needed. For seizure 01/10/14   Historical Provider, MD  Gabapentin, PHN, 300 MG TABS Take 1 tablet by mouth 3 (three) times daily.     Historical Provider, MD  ibuprofen (ADVIL,MOTRIN) 800 MG tablet Take 800 mg by mouth every 6 (six) hours as needed for moderate pain.  01/30/14   Historical Provider, MD  levothyroxine (SYNTHROID, LEVOTHROID) 50 MCG tablet Take 50 mcg by mouth daily.  02/18/12   Historical Provider, MD  methylphenidate (RITALIN) 20 MG tablet Take 20 mg by mouth daily as needed. For tensioin span 01/22/12   Historical Provider, MD  montelukast (SINGULAIR) 10 MG tablet Take 10 mg by mouth daily. 08/26/13   Historical Provider, MD  potassium chloride SA (K-DUR,KLOR-CON)  20 MEQ tablet Take 20 mEq by mouth daily. 01/01/14   Historical Provider, MD  PROZAC 20 MG capsule Take 20 mg by mouth daily.  01/03/12   Historical Provider, MD  simvastatin (ZOCOR) 20 MG tablet 20 mg daily.  02/12/12   Historical Provider, MD  TECFIDERA 240 MG CPDR Take 240 mg by mouth daily. 09/07/13   Historical Provider, MD  traMADol (ULTRAM) 50 MG tablet Take 1 tablet (50 mg total) by mouth 2 (two) times daily as needed. Patient not taking: Reported on 11/21/2014 02/03/14   Ralene Cork, DO  triamterene-hydrochlorothiazide (MAXZIDE-25) 37.5-25 MG per tablet Take 1 tablet by mouth daily.  02/06/12   Historical Provider, MD   BP 116/71 mmHg   Pulse 65  Temp(Src) 98.3 F (36.8 C) (Oral)  Resp 18  Ht 5\' 2"  (1.575 m)  Wt 189 lb 4.8 oz (85.866 kg)  BMI 34.61 kg/m2  SpO2 95% Physical Exam  Nursing note and vitals reviewed.  CONSTITUTIONAL: Well developed/well nourished HEAD: Normocephalic/atraumatic EYES: EOMI/PERRL ENMT: Mucous membranes moist NECK: supple no meningeal signs SPINE/BACK:entire spine nontender CV: S1/S2 noted, no murmurs/rubs/gallops noted LUNGS: Lungs are clear to auscultation bilaterally, no apparent distress ABDOMEN: soft, nontender, no rebound or guarding, bowel sounds noted throughout abdomen GU:no cva tenderness NEURO: Pt is awake/alert/appropriate, moves all extremitiesx4.  No facial droop.   EXTREMITIES: pulses normal/equal, full ROM, mild tenderness to right knee and right thigh. No bruising noted. No other extremity tenderness noted. Patient able to ambulate without difficulty.  No extremity deformity noted SKIN: warm, color normal PSYCH: no abnormalities of mood noted, alert and oriented to situation   ED Course  Procedures   DIAGNOSTIC STUDIES: Oxygen Saturation is 95% on RA, adequate by my interpretation.    COORDINATION OF CARE: 2:08 PM  Discussed treatment plan with pt at bedside and pt agreed to plan. Return precautions given.   Pt able to ambulate without difficulty I have low suspicion for occult fx at this time Will give crutches ad advise rest Given ortho referral Imaging Review Dg Knee Complete 4 Views Right  05/23/2015   CLINICAL DATA:  Fall 2 days ago with right knee pain.  EXAM: RIGHT KNEE - COMPLETE 4+ VIEW  COMPARISON:  02/26/2013  FINDINGS: Exam demonstrates mild diffuse decreased bone mineralization. There is no acute fracture or dislocation. No significant joint effusion. There are mild early degenerative changes of the medial compartment.  IMPRESSION: No acute findings.   Electronically Signed   By: Elberta Fortis M.D.   On: 05/23/2015 13:53   Dg Femur, Min 2 Views  Right  05/23/2015   CLINICAL DATA:  Low back, right hip and thigh pain after falling 2 days ago. Initial encounter.  EXAM: RIGHT FEMUR 2 VIEWS  COMPARISON:  Pelvic and hip radiographs 02/03/2014.  FINDINGS: The mineralization and alignment are normal. There is no evidence of acute fracture or dislocation. Moderate osteoarthritis of the right hip appears stable with fragmented spurring of the acetabulum. Probable injection granulomas overlapping the right ischium and iliac crest, also similar to the prior study.  IMPRESSION: No acute osseous findings demonstrated within the right thigh. Moderate right hip osteoarthritis.   Electronically Signed   By: Carey Bullocks M.D.   On: 05/23/2015 13:54     MDM   Final diagnoses:  Hip sprain, right, initial encounter  Right thigh pain  Sprain of right knee, initial encounter    Nursing notes including past medical history and social history reviewed and considered in  documentation xrays/imaging reviewed by myself and considered during evaluation   I personally performed the services described in this documentation, which was scribed in my presence. The recorded information has been reviewed and is accurate.      Zadie Rhine, MD 05/23/15 [phone]

## 2015-05-23 NOTE — ED Notes (Signed)
Returned from X-Ray.

## 2015-08-09 ENCOUNTER — Encounter (HOSPITAL_COMMUNITY)
Admission: RE | Admit: 2015-08-09 | Discharge: 2015-08-09 | Disposition: A | Discharge status: Home / Self Care | Payer: BLUE CROSS/BLUE SHIELD | Source: Ambulatory Visit | Attending: Psychiatry | Admitting: Psychiatry

## 2015-08-09 DIAGNOSIS — G35 Multiple sclerosis: Secondary | ICD-10-CM | POA: Diagnosis present

## 2015-08-09 MED ORDER — SODIUM CHLORIDE 0.9 % IV SOLN
1000.0000 mg | INTRAVENOUS | Status: DC
  Administered 2015-08-09: 1000 mg via INTRAVENOUS
  Filled 2015-08-09: qty 100

## 2015-08-09 MED ORDER — ACETAMINOPHEN 325 MG PO TABS
650.0000 mg | ORAL_TABLET | Freq: Every day | ORAL | Status: DC
  Administered 2015-08-09: 650 mg via ORAL
  Filled 2015-08-09: qty 2

## 2015-08-09 MED ORDER — DIPHENHYDRAMINE HCL 50 MG/ML IJ SOLN
50.0000 mg | Freq: Every day | INTRAMUSCULAR | Status: DC
  Administered 2015-08-09: 50 mg via INTRAVENOUS
  Filled 2015-08-09: qty 1

## 2015-08-09 MED ORDER — METHYLPREDNISOLONE SODIUM SUCC 125 MG IJ SOLR
125.0000 mg | Freq: Every day | INTRAMUSCULAR | Status: DC
Start: 2015-08-09 — End: 2015-08-10
  Administered 2015-08-09: 125 mg via INTRAVENOUS
  Filled 2015-08-09: qty 2

## 2015-08-09 MED ORDER — SODIUM CHLORIDE 0.9 % IV SOLN
Freq: Every day | INTRAVENOUS | Status: DC
  Administered 2015-08-09: 250 mL via INTRAVENOUS

## 2015-08-09 NOTE — Discharge Instructions (Signed)
Rituximab injection What is this medicine? RITUXIMAB (ri TUX i mab) is a monoclonal antibody. It is used commonly to treat non-Hodgkin lymphoma and other conditions. It is also used to treat rheumatoid arthritis (RA). In RA, this medicine slows the inflammatory process and help reduce joint pain and swelling. This medicine is often used with other cancer or arthritis medications. This medicine may be used for other purposes; ask your health care provider or pharmacist if you have questions. What should I tell my health care provider before I take this medicine? They need to know if you have any of these conditions: -blood disorders -heart disease -history of hepatitis B -infection (especially a virus infection such as chickenpox, cold sores, or herpes) -irregular heartbeat -kidney disease -lung or breathing disease, like asthma -lupus -an unusual or allergic reaction to rituximab, mouse proteins, other medicines, foods, dyes, or preservatives -pregnant or trying to get pregnant -breast-feeding How should I use this medicine? This medicine is for infusion into a vein. It is administered in a hospital or clinic by a specially trained health care professional. A special MedGuide will be given to you by the pharmacist with each prescription and refill. Be sure to read this information carefully each time. Talk to your pediatrician regarding the use of this medicine in children. This medicine is not approved for use in children. Overdosage: If you think you have taken too much of this medicine contact a poison control center or emergency room at once. NOTE: This medicine is only for you. Do not share this medicine with others. What if I miss a dose? It is important not to miss a dose. Call your doctor or health care professional if you are unable to keep an appointment. What may interact with this medicine? -cisplatin -medicines for blood pressure -some other medicines for  arthritis -vaccines This list may not describe all possible interactions. Give your health care provider a list of all the medicines, herbs, non-prescription drugs, or dietary supplements you use. Also tell them if you smoke, drink alcohol, or use illegal drugs. Some items may interact with your medicine. What should I watch for while using this medicine? Report any side effects that you notice during your treatment right away, such as changes in your breathing, fever, chills, dizziness or lightheadedness. These effects are more common with the first dose. Visit your prescriber or health care professional for checks on your progress. You will need to have regular blood work. Report any other side effects. The side effects of this medicine can continue after you finish your treatment. Continue your course of treatment even though you feel ill unless your doctor tells you to stop. Call your doctor or health care professional for advice if you get a fever, chills or sore throat, or other symptoms of a cold or flu. Do not treat yourself. This drug decreases your body's ability to fight infections. Try to avoid being around people who are sick. This medicine may increase your risk to bruise or bleed. Call your doctor or health care professional if you notice any unusual bleeding. Be careful brushing and flossing your teeth or using a toothpick because you may get an infection or bleed more easily. If you have any dental work done, tell your dentist you are receiving this medicine. Avoid taking products that contain aspirin, acetaminophen, ibuprofen, naproxen, or ketoprofen unless instructed by your doctor. These medicines may hide a fever. Do not become pregnant while taking this medicine. Women should inform their doctor if   they wish to become pregnant or think they might be pregnant. There is a potential for serious side effects to an unborn child. Talk to your health care professional or pharmacist for more  information. Do not breast-feed an infant while taking this medicine. What side effects may I notice from receiving this medicine? Side effects that you should report to your doctor or health care professional as soon as possible: -allergic reactions like skin rash, itching or hives, swelling of the face, lips, or tongue -low blood counts - this medicine may decrease the number of white blood cells, red blood cells and platelets. You may be at increased risk for infections and bleeding. -signs of infection - fever or chills, cough, sore throat, pain or difficulty passing urine -signs of decreased platelets or bleeding - bruising, pinpoint red spots on the skin, black, tarry stools, blood in the urine -signs of decreased red blood cells - unusually weak or tired, fainting spells, lightheadedness -breathing problems -confused, not responsive -chest pain -fast, irregular heartbeat -feeling faint or lightheaded, falls -mouth sores -redness, blistering, peeling or loosening of the skin, including inside the mouth -stomach pain -swelling of the ankles, feet, or hands -trouble passing urine or change in the amount of urine Side effects that usually do not require medical attention (report to your doctor or other health care professional if they continue or are bothersome): -anxiety -headache -loss of appetite -muscle aches -nausea -night sweats This list may not describe all possible side effects. Call your doctor for medical advice about side effects. You may report side effects to FDA at 1-800-FDA-1088. Where should I keep my medicine? This drug is given in a hospital or clinic and will not be stored at home. NOTE: This sheet is a summary. It may not cover all possible information. If you have questions about this medicine, talk to your doctor, pharmacist, or health care provider.    2016, [employer]vier/[employer] Standard. (2014-12-01 22:30:56)  

## 2015-08-09 NOTE — Progress Notes (Signed)
Uneventful infusion of Rituxan #1/2 of this cycle at the Dose 1 rate of 50mg /hr up to a maximum of 400mg /hr. Pt states she tolerated this rate better than the Maintenance Dose rate of 100mg /hr to a maximum of 400mg /hr( which is typically infused with the 2nd infusion in a series if first infusion was uneventful) because she "feels tired for a week after it is infused at that rate". Pt is to call her Dr for any questions or concerns

## 2015-08-24 ENCOUNTER — Inpatient Hospital Stay (HOSPITAL_COMMUNITY): Admission: RE | Admit: 2015-08-24 | Payer: Medicare Other | Source: Ambulatory Visit

## 2015-08-26 ENCOUNTER — Encounter (HOSPITAL_COMMUNITY): Payer: Self-pay

## 2015-08-26 ENCOUNTER — Ambulatory Visit (HOSPITAL_COMMUNITY)
Admission: RE | Admit: 2015-08-26 | Discharge: 2015-08-26 | Disposition: A | Discharge status: Home / Self Care | Payer: BLUE CROSS/BLUE SHIELD | Source: Ambulatory Visit | Attending: Psychiatry | Admitting: Psychiatry

## 2015-08-26 DIAGNOSIS — G35 Multiple sclerosis: Secondary | ICD-10-CM | POA: Insufficient documentation

## 2015-08-26 MED ORDER — SODIUM CHLORIDE 0.9 % IV SOLN
1000.0000 mg | INTRAVENOUS | Status: AC
  Administered 2015-08-26: 1000 mg via INTRAVENOUS
  Filled 2015-08-26: qty 100

## 2015-08-26 MED ORDER — SODIUM CHLORIDE 0.9 % IV SOLN
Freq: Every day | INTRAVENOUS | Status: AC
Start: 2015-08-26 — End: 2015-08-26
  Administered 2015-08-26: 250 mL via INTRAVENOUS

## 2015-08-26 MED ORDER — DIPHENHYDRAMINE HCL 50 MG/ML IJ SOLN
50.0000 mg | Freq: Every day | INTRAMUSCULAR | Status: AC
  Administered 2015-08-26: 50 mg via INTRAVENOUS
  Filled 2015-08-26: qty 1

## 2015-08-26 MED ORDER — ACETAMINOPHEN 325 MG PO TABS
650.0000 mg | ORAL_TABLET | Freq: Every day | ORAL | Status: AC
  Administered 2015-08-26: 650 mg via ORAL
  Filled 2015-08-26: qty 2

## 2015-08-26 MED ORDER — METHYLPREDNISOLONE SODIUM SUCC 125 MG IJ SOLR
125.0000 mg | Freq: Every day | INTRAMUSCULAR | Status: AC
  Administered 2015-08-26: 125 mg via INTRAVENOUS
  Filled 2015-08-26: qty 2

## 2015-08-26 NOTE — Progress Notes (Signed)
Uneventful infusion #2/2 of Rituxan in this series of every 6 month infusion. Rate was infused (see previous note) at Dose 1 rate of 50mg /h increasing every 30 minutes at 50 mg increments to a max or 400mg /hr. Pt states she tolerated this better . Pt has dozed at intervals and has eaten lunch. VSS and no c/o. Pt is ready for discharge

## 2016-02-24 ENCOUNTER — Encounter (HOSPITAL_COMMUNITY): Payer: Self-pay

## 2016-02-24 ENCOUNTER — Other Ambulatory Visit (HOSPITAL_COMMUNITY): Payer: Self-pay | Admitting: Psychiatry

## 2016-02-24 ENCOUNTER — Encounter (HOSPITAL_COMMUNITY)
Admission: RE | Admit: 2016-02-24 | Discharge: 2016-02-24 | Disposition: A | Discharge status: Home / Self Care | Payer: BLUE CROSS/BLUE SHIELD | Source: Ambulatory Visit | Attending: Psychiatry | Admitting: Psychiatry

## 2016-02-24 DIAGNOSIS — G35 Multiple sclerosis: Secondary | ICD-10-CM | POA: Insufficient documentation

## 2016-02-24 MED ORDER — DIPHENHYDRAMINE HCL 50 MG/ML IJ SOLN
50.0000 mg | Freq: Once | INTRAMUSCULAR | Status: AC
  Administered 2016-02-24: 50 mg via INTRAVENOUS
  Filled 2016-02-24: qty 1

## 2016-02-24 MED ORDER — ACETAMINOPHEN 325 MG PO TABS
650.0000 mg | ORAL_TABLET | Freq: Once | ORAL | Status: AC
  Administered 2016-02-24: 650 mg via ORAL
  Filled 2016-02-24: qty 2

## 2016-02-24 MED ORDER — METHYLPREDNISOLONE SODIUM SUCC 125 MG IJ SOLR
125.0000 mg | Freq: Once | INTRAMUSCULAR | Status: AC
  Administered 2016-02-24: 125 mg via INTRAVENOUS
  Filled 2016-02-24: qty 2

## 2016-02-24 MED ORDER — SODIUM CHLORIDE 0.9 % IV SOLN
Freq: Once | INTRAVENOUS | Status: AC
  Administered 2016-02-24: 08:00:00 via INTRAVENOUS

## 2016-02-24 MED ORDER — SODIUM CHLORIDE 0.9 % IV SOLN
1000.0000 mg | Freq: Once | INTRAVENOUS | Status: AC
  Administered 2016-02-24: 1000 mg via INTRAVENOUS
  Filled 2016-02-24: qty 100

## 2016-02-24 NOTE — Discharge Instructions (Signed)
Rituximab injection What is this medicine? RITUXIMAB (ri TUX i mab) is a monoclonal antibody. It is used commonly to treat non-Hodgkin lymphoma and other conditions. It is also used to treat rheumatoid arthritis (RA). In RA, this medicine slows the inflammatory process and help reduce joint pain and swelling. This medicine is often used with other cancer or arthritis medications. This medicine may be used for other purposes; ask your health care provider or pharmacist if you have questions. What should I tell my health care provider before I take this medicine? They need to know if you have any of these conditions: -blood disorders -heart disease -history of hepatitis B -infection (especially a virus infection such as chickenpox, cold sores, or herpes) -irregular heartbeat -kidney disease -lung or breathing disease, like asthma -lupus -an unusual or allergic reaction to rituximab, mouse proteins, other medicines, foods, dyes, or preservatives -pregnant or trying to get pregnant -breast-feeding How should I use this medicine? This medicine is for infusion into a vein. It is administered in a hospital or clinic by a specially trained health care professional. A special MedGuide will be given to you by the pharmacist with each prescription and refill. Be sure to read this information carefully each time. Talk to your pediatrician regarding the use of this medicine in children. This medicine is not approved for use in children. Overdosage: If you think you have taken too much of this medicine contact a poison control center or emergency room at once. NOTE: This medicine is only for you. Do not share this medicine with others. What if I miss a dose? It is important not to miss a dose. Call your doctor or health care professional if you are unable to keep an appointment. What may interact with this medicine? -cisplatin -medicines for blood pressure -some other medicines for  arthritis -vaccines This list may not describe all possible interactions. Give your health care provider a list of all the medicines, herbs, non-prescription drugs, or dietary supplements you use. Also tell them if you smoke, drink alcohol, or use illegal drugs. Some items may interact with your medicine. What should I watch for while using this medicine? Report any side effects that you notice during your treatment right away, such as changes in your breathing, fever, chills, dizziness or lightheadedness. These effects are more common with the first dose. Visit your prescriber or health care professional for checks on your progress. You will need to have regular blood work. Report any other side effects. The side effects of this medicine can continue after you finish your treatment. Continue your course of treatment even though you feel ill unless your doctor tells you to stop. Call your doctor or health care professional for advice if you get a fever, chills or sore throat, or other symptoms of a cold or flu. Do not treat yourself. This drug decreases your body's ability to fight infections. Try to avoid being around people who are sick. This medicine may increase your risk to bruise or bleed. Call your doctor or health care professional if you notice any unusual bleeding. Be careful brushing and flossing your teeth or using a toothpick because you may get an infection or bleed more easily. If you have any dental work done, tell your dentist you are receiving this medicine. Avoid taking products that contain aspirin, acetaminophen, ibuprofen, naproxen, or ketoprofen unless instructed by your doctor. These medicines may hide a fever. Do not become pregnant while taking this medicine. Women should inform their doctor if   they wish to become pregnant or think they might be pregnant. There is a potential for serious side effects to an unborn child. Talk to your health care professional or pharmacist for more  information. Do not breast-feed an infant while taking this medicine. What side effects may I notice from receiving this medicine? Side effects that you should report to your doctor or health care professional as soon as possible: -allergic reactions like skin rash, itching or hives, swelling of the face, lips, or tongue -low blood counts - this medicine may decrease the number of white blood cells, red blood cells and platelets. You may be at increased risk for infections and bleeding. -signs of infection - fever or chills, cough, sore throat, pain or difficulty passing urine -signs of decreased platelets or bleeding - bruising, pinpoint red spots on the skin, black, tarry stools, blood in the urine -signs of decreased red blood cells - unusually weak or tired, fainting spells, lightheadedness -breathing problems -confused, not responsive -chest pain -fast, irregular heartbeat -feeling faint or lightheaded, falls -mouth sores -redness, blistering, peeling or loosening of the skin, including inside the mouth -stomach pain -swelling of the ankles, feet, or hands -trouble passing urine or change in the amount of urine Side effects that usually do not require medical attention (report to your doctor or other health care professional if they continue or are bothersome): -anxiety -headache -loss of appetite -muscle aches -nausea -night sweats This list may not describe all possible side effects. Call your doctor for medical advice about side effects. You may report side effects to FDA at 1-800-FDA-1088. Where should I keep my medicine? This drug is given in a hospital or clinic and will not be stored at home. NOTE: This sheet is a summary. It may not cover all possible information. If you have questions about this medicine, talk to your doctor, pharmacist, or health care provider.    2016, [employer]vier/[employer] Standard. (2014-12-01 22:30:56)  

## 2016-02-24 NOTE — Progress Notes (Signed)
Rituxan infusion #1 of 2 completed today without incidence.  Pt tolerated well.  Started at /hr and increased every 30 minutes until /hr was obtained.  Pt d/c home ambulatory with husband.  Pt given next appointment for June 2. D/c paperwork on Rituxan also given to pt.

## 2016-03-09 ENCOUNTER — Encounter (HOSPITAL_COMMUNITY): Payer: Self-pay

## 2016-03-09 ENCOUNTER — Encounter (HOSPITAL_COMMUNITY)
Admission: RE | Admit: 2016-03-09 | Discharge: 2016-03-09 | Disposition: A | Discharge status: Home / Self Care | Payer: BLUE CROSS/BLUE SHIELD | Source: Ambulatory Visit | Attending: Psychiatry | Admitting: Psychiatry

## 2016-03-09 DIAGNOSIS — G35 Multiple sclerosis: Secondary | ICD-10-CM | POA: Insufficient documentation

## 2016-03-09 MED ORDER — DIPHENHYDRAMINE HCL 50 MG/ML IJ SOLN
50.0000 mg | Freq: Once | INTRAMUSCULAR | Status: AC
  Administered 2016-03-09: 50 mg via INTRAVENOUS
  Filled 2016-03-09: qty 1

## 2016-03-09 MED ORDER — METHYLPREDNISOLONE SODIUM SUCC 125 MG IJ SOLR
125.0000 mg | Freq: Once | INTRAMUSCULAR | Status: AC
  Administered 2016-03-09: 125 mg via INTRAVENOUS
  Filled 2016-03-09: qty 2

## 2016-03-09 MED ORDER — ACETAMINOPHEN 325 MG PO TABS
650.0000 mg | ORAL_TABLET | Freq: Once | ORAL | Status: AC
  Administered 2016-03-09: 650 mg via ORAL
  Filled 2016-03-09: qty 2

## 2016-03-09 MED ORDER — SODIUM CHLORIDE 0.9 % IV SOLN
1000.0000 mg | Freq: Once | INTRAVENOUS | Status: AC
  Administered 2016-03-09: 1000 mg via INTRAVENOUS
  Filled 2016-03-09: qty 100

## 2016-03-09 MED ORDER — SODIUM CHLORIDE 0.9 % IV SOLN
Freq: Once | INTRAVENOUS | Status: AC
  Administered 2016-03-09: 08:00:00 via INTRAVENOUS

## 2016-03-09 NOTE — Progress Notes (Signed)
Uneventful infusion of Rituxan #2/2 this series at the Dose 1 rate. Pt slept and then up to BR x2 with minimal assist

## 2016-03-09 NOTE — Progress Notes (Signed)
Pt here today for #2/2 Rituxan  in this series. She prefers to have it infused at Dose 1 rate stating" that the maintenace dose rate makes me feel so tired for a few days afterwards" So currently is infusing at /hr increments increasing it every 30 minutes to max dose of /hr until the end of the infusion

## 2016-09-07 ENCOUNTER — Encounter (HOSPITAL_COMMUNITY): Payer: Self-pay

## 2016-09-07 ENCOUNTER — Encounter (HOSPITAL_COMMUNITY)
Admission: RE | Admit: 2016-09-07 | Discharge: 2016-09-07 | Disposition: A | Discharge status: Home / Self Care | Payer: BLUE CROSS/BLUE SHIELD | Source: Ambulatory Visit | Attending: Psychiatry | Admitting: Psychiatry

## 2016-09-07 DIAGNOSIS — G35 Multiple sclerosis: Secondary | ICD-10-CM | POA: Diagnosis not present

## 2016-09-07 MED ORDER — SODIUM CHLORIDE 0.9 % IV SOLN
INTRAVENOUS | Status: DC
  Administered 2016-09-07: 08:00:00 via INTRAVENOUS

## 2016-09-07 MED ORDER — METHYLPREDNISOLONE SODIUM SUCC 125 MG IJ SOLR
125.0000 mg | INTRAMUSCULAR | Status: DC
  Administered 2016-09-07: 125 mg via INTRAVENOUS
  Filled 2016-09-07: qty 2

## 2016-09-07 MED ORDER — ACETAMINOPHEN 325 MG PO TABS
650.0000 mg | ORAL_TABLET | ORAL | Status: DC
  Administered 2016-09-07: 650 mg via ORAL
  Filled 2016-09-07: qty 2

## 2016-09-07 MED ORDER — DIPHENHYDRAMINE HCL 50 MG/ML IJ SOLN
50.0000 mg | INTRAMUSCULAR | Status: DC
  Administered 2016-09-07: 50 mg via INTRAVENOUS
  Filled 2016-09-07: qty 1

## 2016-09-07 MED ORDER — SODIUM CHLORIDE 0.9 % IV SOLN
1000.0000 mg | INTRAVENOUS | Status: DC
  Administered 2016-09-07: 1000 mg via INTRAVENOUS
  Filled 2016-09-07: qty 100

## 2016-09-21 ENCOUNTER — Encounter (HOSPITAL_COMMUNITY)
Admission: RE | Admit: 2016-09-21 | Discharge: 2016-09-21 | Disposition: A | Discharge status: Home / Self Care | Payer: BLUE CROSS/BLUE SHIELD | Source: Ambulatory Visit | Attending: Psychiatry | Admitting: Psychiatry

## 2016-09-21 ENCOUNTER — Encounter (HOSPITAL_COMMUNITY): Payer: Self-pay

## 2016-09-21 DIAGNOSIS — G35 Multiple sclerosis: Secondary | ICD-10-CM | POA: Diagnosis not present

## 2016-09-21 MED ORDER — SODIUM CHLORIDE 0.9 % IV SOLN
INTRAVENOUS | Status: AC
  Administered 2016-09-21: 09:00:00 via INTRAVENOUS

## 2016-09-21 MED ORDER — SODIUM CHLORIDE 0.9 % IV SOLN
1000.0000 mg | INTRAVENOUS | Status: AC
  Administered 2016-09-21: 1000 mg via INTRAVENOUS
  Filled 2016-09-21: qty 100

## 2016-09-21 MED ORDER — ACETAMINOPHEN 325 MG PO TABS
650.0000 mg | ORAL_TABLET | ORAL | Status: AC
  Administered 2016-09-21: 650 mg via ORAL
  Filled 2016-09-21 (×2): qty 2

## 2016-09-21 MED ORDER — DIPHENHYDRAMINE HCL 50 MG/ML IJ SOLN
50.0000 mg | INTRAMUSCULAR | Status: AC
  Administered 2016-09-21: 50 mg via INTRAVENOUS
  Filled 2016-09-21: qty 1

## 2016-09-21 MED ORDER — METHYLPREDNISOLONE SODIUM SUCC 125 MG IJ SOLR
125.0000 mg | INTRAMUSCULAR | Status: AC
  Administered 2016-09-21: 125 mg via INTRAVENOUS
  Filled 2016-09-21: qty 2

## 2016-10-29 IMAGING — CT CT ANGIO CHEST
2 of 9 series · 18 of 46 positions shown · IV contrast (Omni 300)
Comparison: None.

CLINICAL DATA: Chest pain and elevated D-dimer.

EXAM:
CT ANGIOGRAPHY CHEST WITH CONTRAST
TECHNIQUE: Multidetector CT imaging of the chest was performed using the
standard protocol during bolus administration of intravenous
contrast. Multiplanar CT image reconstructions and MIPs were
obtained to evaluate the vascular anatomy.
CONTRAST:  100mL OMNIPAQUE IOHEXOL 350 MG/ML SOLN

[Series 5: thins · axial · 0.58mm/px · z∈[-225,-27]mm · 15 of 228 slices shown]
[im 15/228  lung]
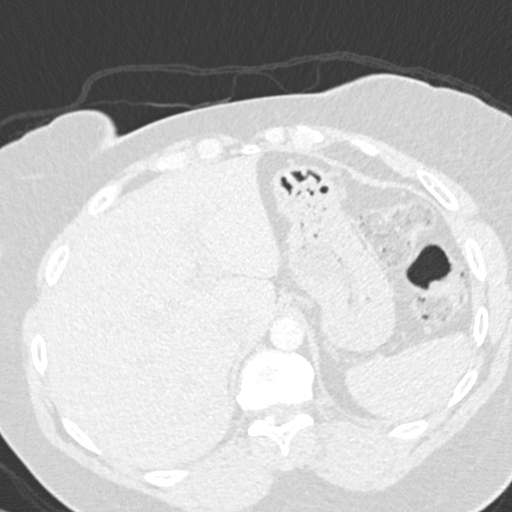
[im 29/228  soft-tissue]
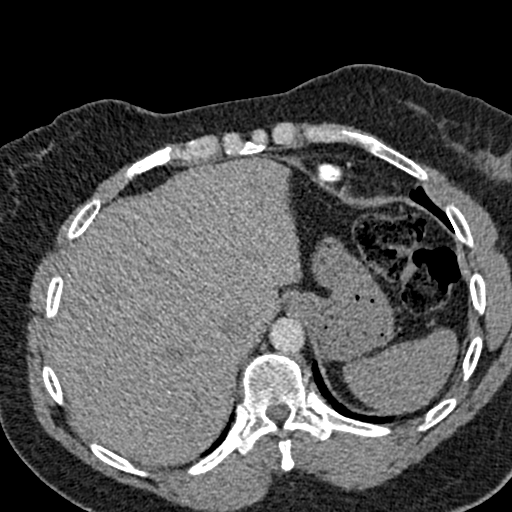
[im 43/228  lung]
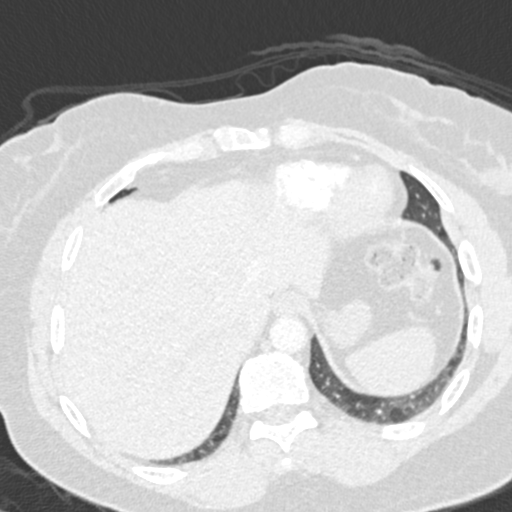
[im 57/228  soft-tissue]
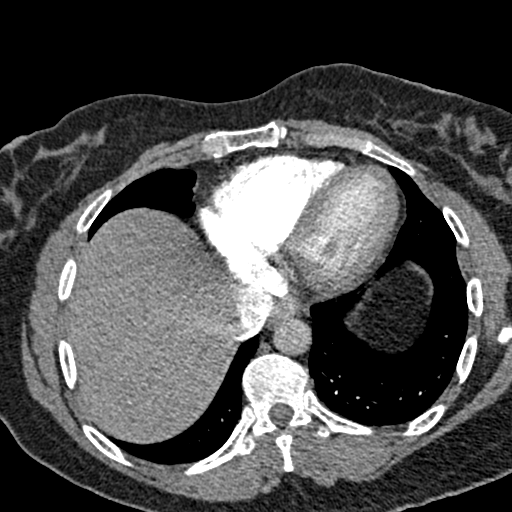
[im 71/228  lung]
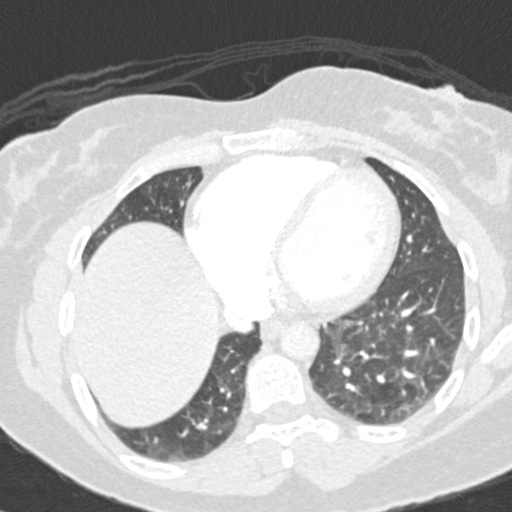
[im 86/228  soft-tissue]
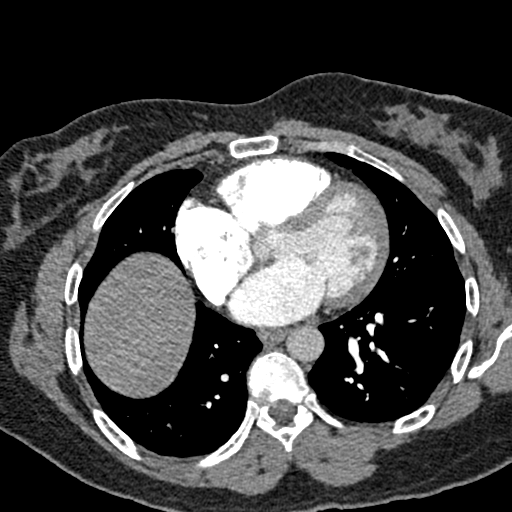
[im 100/228  lung]
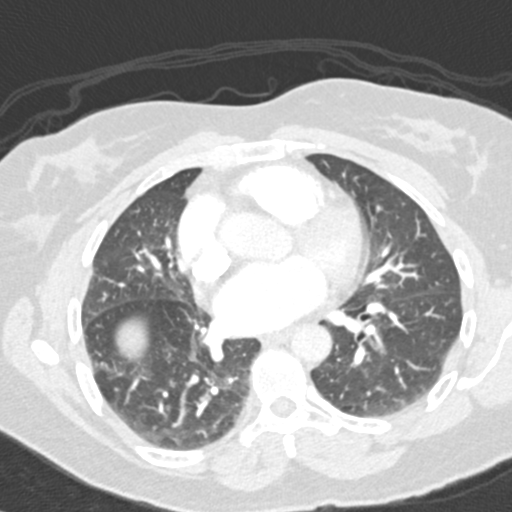
[im 114/228  soft-tissue]
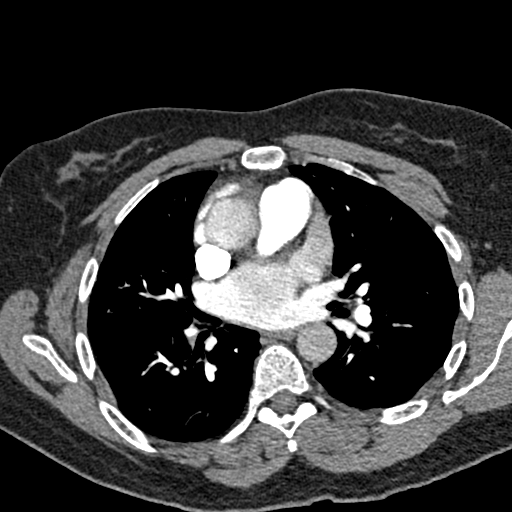
[im 128/228  lung]
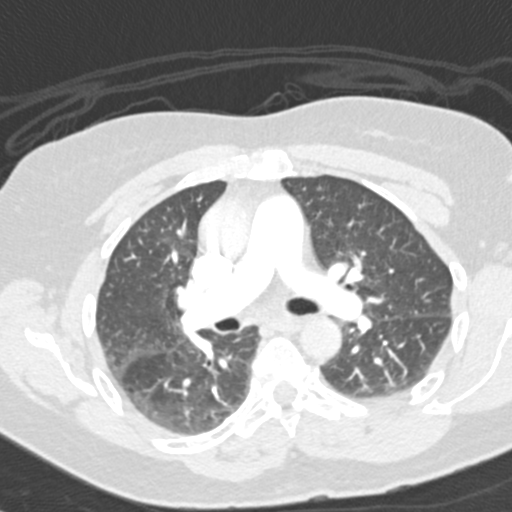
[im 142/228  soft-tissue]
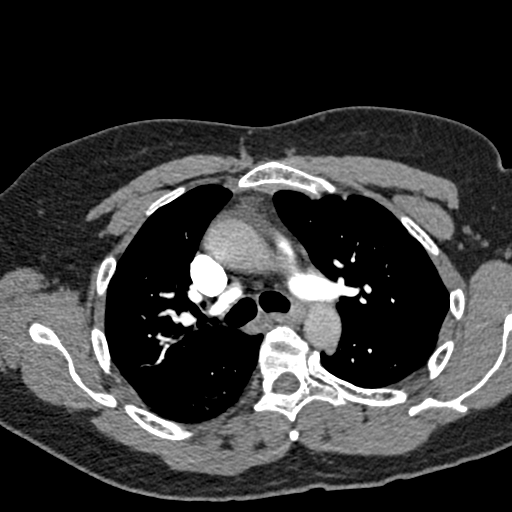
[im 157/228  lung]
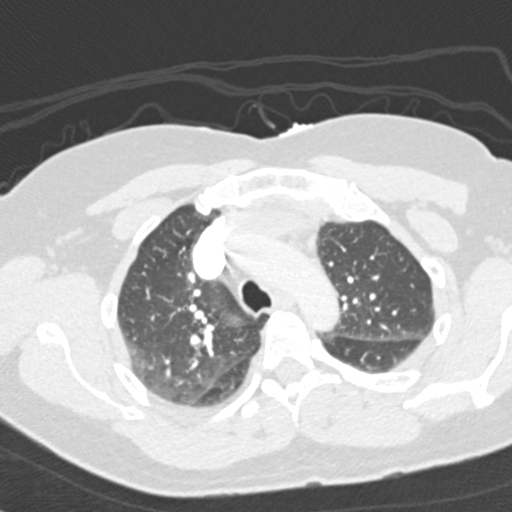
[im 171/228  soft-tissue]
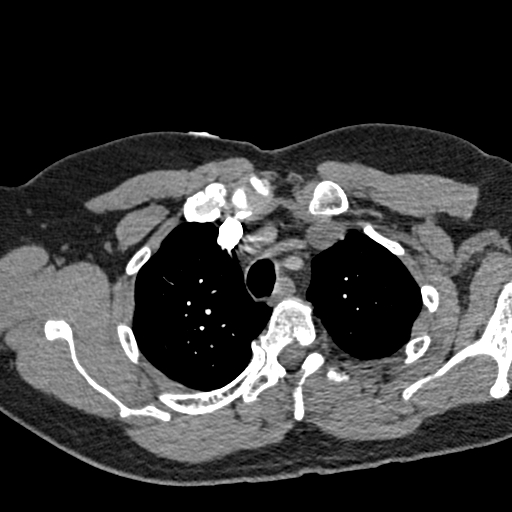
[im 185/228  lung]
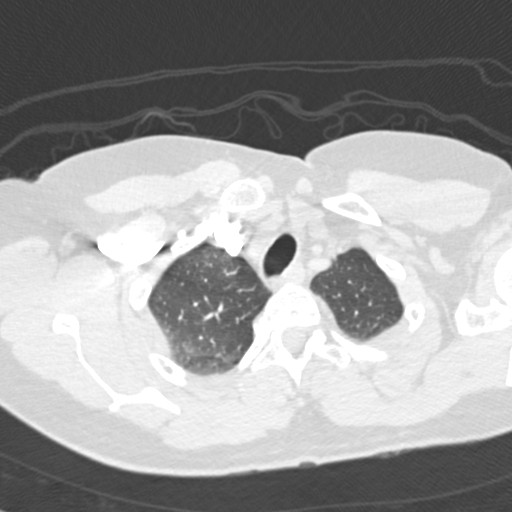
[im 199/228  soft-tissue]
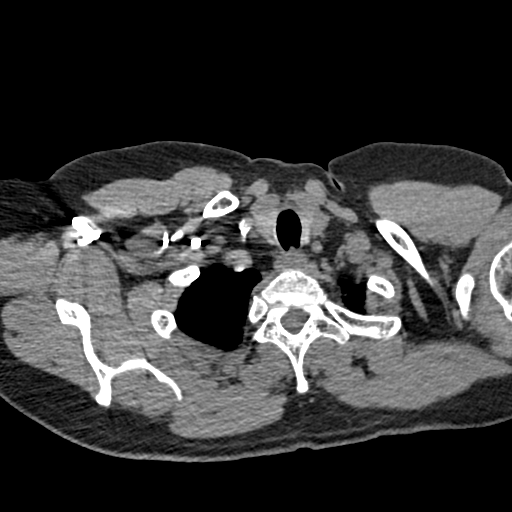
[im 213/228  lung]
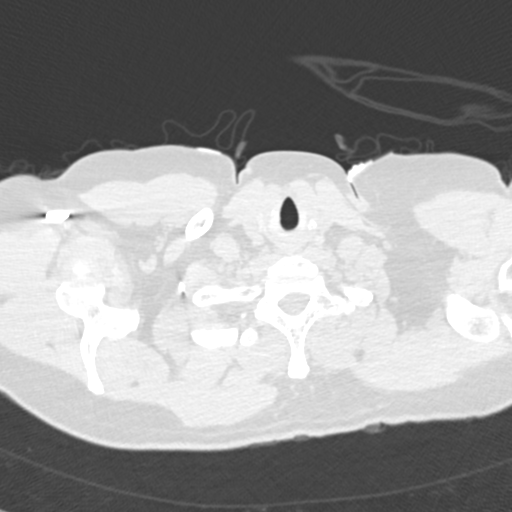

[Series 7: coronal mpr · coronal · 0.59mm/px · 3 of 102 slices shown]
[im 26/102  soft-tissue]
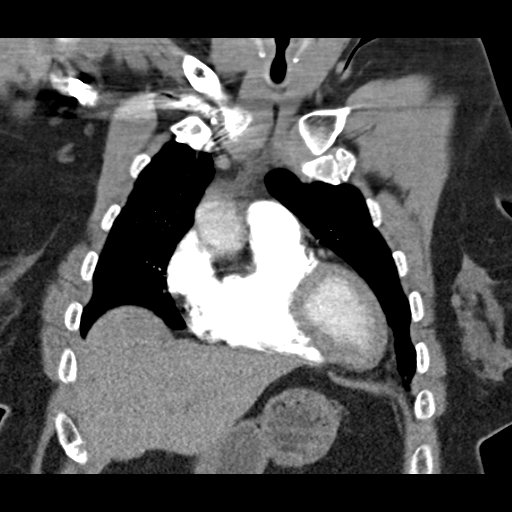
[im 51/102  soft-tissue]
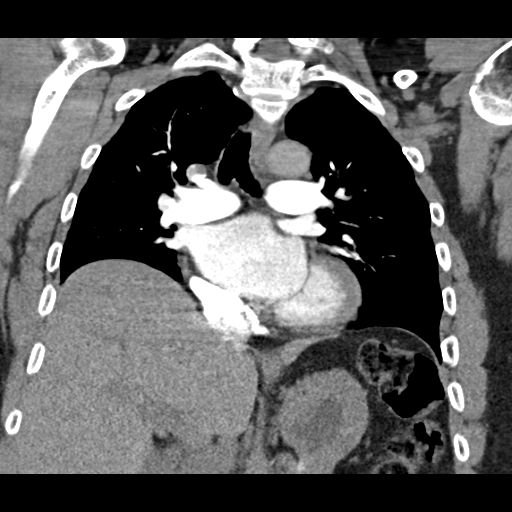
[im 76/102  soft-tissue]
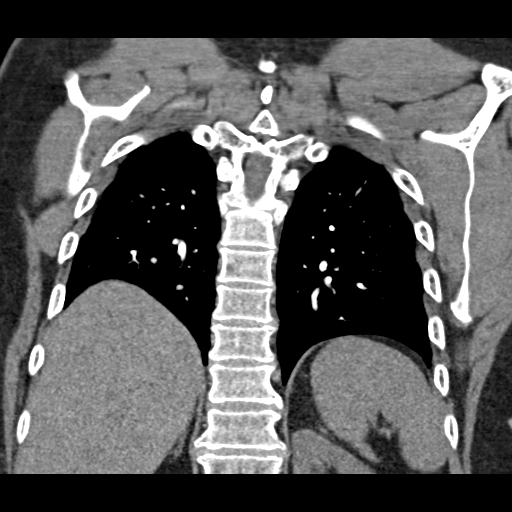

[18 of 46 positions shown; findings below may reference images not displayed]

FINDINGS: Mediastinum/Lymph Nodes: Satisfactory opacification of pulmonary
arteries is noted, and no pulmonary embolism identified. No masses
or pathologically enlarged lymph nodes identified. Mild cardiomegaly
noted.

Lungs/Pleura: No pulmonary infiltrate or mass identified. No
effusion present.

Musculoskeletal/Soft Tissues: No suspicious bone lesions or other
significant chest wall abnormality.

Upper Abdomen:  Unremarkable.

Review of the MIP images confirms the above findings.
IMPRESSION: Negative. No evidence of pulmonary embolism or other active disease
within the thorax.

## 2016-11-22 ENCOUNTER — Other Ambulatory Visit (HOSPITAL_COMMUNITY): Payer: Self-pay | Admitting: Psychiatry

## 2016-11-22 DIAGNOSIS — G35 Multiple sclerosis: Secondary | ICD-10-CM

## 2016-11-27 ENCOUNTER — Ambulatory Visit (HOSPITAL_COMMUNITY)
Admission: RE | Admit: 2016-11-27 | Discharge: 2016-11-27 | Disposition: A | Discharge status: Home / Self Care | Payer: BLUE CROSS/BLUE SHIELD | Source: Ambulatory Visit | Attending: Psychiatry | Admitting: Psychiatry

## 2016-11-27 ENCOUNTER — Encounter (HOSPITAL_COMMUNITY): Payer: Self-pay | Admitting: Radiology

## 2016-11-27 DIAGNOSIS — G35 Multiple sclerosis: Secondary | ICD-10-CM | POA: Diagnosis not present

## 2016-11-27 MED ORDER — GADOBENATE DIMEGLUMINE 529 MG/ML IV SOLN
20.0000 mL | Freq: Once | INTRAVENOUS | Status: AC | PRN
  Administered 2016-11-27: 17 mL via INTRAVENOUS

## 2017-03-26 ENCOUNTER — Encounter (HOSPITAL_COMMUNITY): Payer: Self-pay

## 2017-03-26 ENCOUNTER — Encounter (HOSPITAL_COMMUNITY)
Admission: RE | Admit: 2017-03-26 | Discharge: 2017-03-26 | Disposition: A | Discharge status: Home / Self Care | Payer: BLUE CROSS/BLUE SHIELD | Source: Ambulatory Visit | Attending: Psychiatry | Admitting: Psychiatry

## 2017-03-26 DIAGNOSIS — G35 Multiple sclerosis: Secondary | ICD-10-CM | POA: Insufficient documentation

## 2017-03-26 MED ORDER — METHYLPREDNISOLONE SODIUM SUCC 125 MG IJ SOLR
125.0000 mg | INTRAMUSCULAR | Status: DC
  Administered 2017-03-26: 125 mg via INTRAVENOUS
  Filled 2017-03-26: qty 2

## 2017-03-26 MED ORDER — DIPHENHYDRAMINE HCL 25 MG PO CAPS
50.0000 mg | ORAL_CAPSULE | ORAL | Status: DC
Start: 2017-03-26 — End: 2017-03-27
  Administered 2017-03-26: 50 mg via ORAL
  Filled 2017-03-26: qty 2

## 2017-03-26 MED ORDER — SODIUM CHLORIDE 0.9 % IV SOLN
1000.0000 mg | INTRAVENOUS | Status: DC
  Administered 2017-03-26: 1000 mg via INTRAVENOUS
  Filled 2017-03-26: qty 100

## 2017-03-26 MED ORDER — ACETAMINOPHEN 325 MG PO TABS
650.0000 mg | ORAL_TABLET | ORAL | Status: DC
  Administered 2017-03-26: 650 mg via ORAL
  Filled 2017-03-26: qty 2

## 2017-03-26 MED ORDER — SODIUM CHLORIDE 0.9 % IV SOLN
INTRAVENOUS | Status: DC
  Administered 2017-03-26: 09:00:00 via INTRAVENOUS

## 2017-03-26 NOTE — Progress Notes (Signed)
Pt states she has had sinus pressure, some drainage.  She called Dr. Leotis Shames about this yesterday 6/18, he is who prescribes the Rituxan.  He ordered Cephalexin 1 capsule every 8 hours until gone.  Pt states she has only taken 1 pill.  Pt states she asked Dr. Leotis Shames about rescheduling the rituxan and he told her to go ahead and come for Rituxan today.  Verified medicine bottle and it was prescibed by Dr. Leotis Shames.  Will proceed with Rituxan.

## 2017-04-09 ENCOUNTER — Encounter (HOSPITAL_COMMUNITY): Payer: Self-pay

## 2017-04-09 ENCOUNTER — Ambulatory Visit (HOSPITAL_COMMUNITY)
Admission: RE | Admit: 2017-04-09 | Discharge: 2017-04-09 | Disposition: A | Discharge status: Home / Self Care | Payer: BLUE CROSS/BLUE SHIELD | Source: Ambulatory Visit | Attending: Psychiatry | Admitting: Psychiatry

## 2017-04-09 DIAGNOSIS — G35 Multiple sclerosis: Secondary | ICD-10-CM | POA: Diagnosis present

## 2017-04-09 MED ORDER — ACETAMINOPHEN 325 MG PO TABS
650.0000 mg | ORAL_TABLET | ORAL | Status: AC
  Administered 2017-04-09: 650 mg via ORAL
  Filled 2017-04-09: qty 2

## 2017-04-09 MED ORDER — SODIUM CHLORIDE 0.9 % IV SOLN
INTRAVENOUS | Status: AC
  Administered 2017-04-09: 08:00:00 via INTRAVENOUS

## 2017-04-09 MED ORDER — DIPHENHYDRAMINE HCL 25 MG PO CAPS
50.0000 mg | ORAL_CAPSULE | ORAL | Status: AC
  Administered 2017-04-09: 50 mg via ORAL
  Filled 2017-04-09: qty 2

## 2017-04-09 MED ORDER — SODIUM CHLORIDE 0.9 % IV SOLN
1000.0000 mg | INTRAVENOUS | Status: AC
  Administered 2017-04-09: 1000 mg via INTRAVENOUS
  Filled 2017-04-09: qty 100

## 2017-04-09 MED ORDER — METHYLPREDNISOLONE SODIUM SUCC 125 MG IJ SOLR
125.0000 mg | INTRAMUSCULAR | Status: AC
  Administered 2017-04-09: 125 mg via INTRAVENOUS
  Filled 2017-04-09: qty 2

## 2017-09-27 ENCOUNTER — Encounter (HOSPITAL_COMMUNITY)
Admission: RE | Admit: 2017-09-27 | Discharge: 2017-09-27 | Disposition: A | Discharge status: Home / Self Care | Payer: BLUE CROSS/BLUE SHIELD | Source: Ambulatory Visit | Attending: Psychiatry | Admitting: Psychiatry

## 2017-09-27 ENCOUNTER — Encounter (HOSPITAL_COMMUNITY): Payer: Self-pay

## 2017-09-27 DIAGNOSIS — G35 Multiple sclerosis: Secondary | ICD-10-CM | POA: Insufficient documentation

## 2017-09-27 MED ORDER — DIPHENHYDRAMINE HCL 50 MG/ML IJ SOLN
50.0000 mg | INTRAMUSCULAR | Status: DC
  Administered 2017-09-27: 50 mg via INTRAVENOUS
  Filled 2017-09-27 (×2): qty 1

## 2017-09-27 MED ORDER — METHYLPREDNISOLONE SODIUM SUCC 125 MG IJ SOLR
125.0000 mg | INTRAMUSCULAR | Status: DC
  Administered 2017-09-27: 125 mg via INTRAVENOUS
  Filled 2017-09-27: qty 2

## 2017-09-27 MED ORDER — SODIUM CHLORIDE 0.9 % IV SOLN
INTRAVENOUS | Status: DC
Start: 2017-09-27 — End: 2017-09-28
  Administered 2017-09-27: 09:00:00 via INTRAVENOUS

## 2017-09-27 MED ORDER — SODIUM CHLORIDE 0.9 % IV SOLN
1000.0000 mg | INTRAVENOUS | Status: DC
  Administered 2017-09-27: 1000 mg via INTRAVENOUS
  Filled 2017-09-27: qty 100

## 2017-09-27 MED ORDER — ACETAMINOPHEN 325 MG PO TABS
650.0000 mg | ORAL_TABLET | ORAL | Status: DC
  Administered 2017-09-27: 650 mg via ORAL
  Filled 2017-09-27: qty 2

## 2017-09-27 NOTE — Discharge Instructions (Signed)
Rituximab injection/Rituxan Infusion °What is this medicine? °RITUXIMAB (ri TUX i mab) is a monoclonal antibody. It is used to treat certain types of cancer like non-Hodgkin lymphoma and chronic lymphocytic leukemia. It is also used to treat rheumatoid arthritis, granulomatosis with polyangiitis (or Wegener's granulomatosis), and microscopic polyangiitis. °This medicine may be used for other purposes; ask your health care provider or pharmacist if you have questions. °COMMON BRAND NAME(S): Rituxan °What should I tell my health care provider before I take this medicine? °They need to know if you have any of these conditions: °-heart disease °-infection (especially a virus infection such as hepatitis B, chickenpox, cold sores, or herpes) °-immune system problems °-irregular heartbeat °-kidney disease °-lung or breathing disease, like asthma °-recently received or scheduled to receive a vaccine °-an unusual or allergic reaction to rituximab, mouse proteins, other medicines, foods, dyes, or preservatives °-pregnant or trying to get pregnant °-breast-feeding °How should I use this medicine? °This medicine is for infusion into a vein. It is administered in a hospital or clinic by a specially trained health care professional. °A special MedGuide will be given to you by the pharmacist with each prescription and refill. Be sure to read this information carefully each time. °Talk to your pediatrician regarding the use of this medicine in children. This medicine is not approved for use in children. °Overdosage: If you think you have taken too much of this medicine contact a poison control center or emergency room at once. °NOTE: This medicine is only for you. Do not share this medicine with others. °What if I miss a dose? °It is important not to miss a dose. Call your doctor or health care professional if you are unable to keep an appointment. °What may interact with this medicine? °-cisplatin °-other medicines for arthritis  like disease modifying antirheumatic drugs or tumor necrosis factor inhibitors °-live virus vaccines °This list may not describe all possible interactions. Give your health care provider a list of all the medicines, herbs, non-prescription drugs, or dietary supplements you use. Also tell them if you smoke, drink alcohol, or use illegal drugs. Some items may interact with your medicine. °What should I watch for while using this medicine? °Your condition will be monitored carefully while you are receiving this medicine. You may need blood work done while you are taking this medicine. °This medicine can cause serious allergic reactions. To reduce your risk you may need to take medicine before treatment with this medicine. Take your medicine as directed. °In some patients, this medicine may cause a serious brain infection that may cause death. If you have any problems seeing, thinking, speaking, walking, or standing, tell your doctor right away. If you cannot reach your doctor, urgently seek other source of medical care. °Call your doctor or health care professional for advice if you get a fever, chills or sore throat, or other symptoms of a cold or flu. Do not treat yourself. This drug decreases your body's ability to fight infections. Try to avoid being around people who are sick. °Do not become pregnant while taking this medicine or for 12 months after stopping it. Women should inform their doctor if they wish to become pregnant or think they might be pregnant. There is a potential for serious side effects to an unborn child. Talk to your health care professional or pharmacist for more information. °What side effects may I notice from receiving this medicine? °Side effects that you should report to your doctor or health care professional as soon as possible: °-  breathing problems °-chest pain °-dizziness or feeling faint °-fast, irregular heartbeat °-low blood counts - this medicine may decrease the number of white  blood cells, red blood cells and platelets. You may be at increased risk for infections and bleeding. °-mouth sores °-redness, blistering, peeling or loosening of the skin, including inside the mouth (this can be added for any serious or exfoliative rash that could lead to hospitalization) °-signs of infection - fever or chills, cough, sore throat, pain or difficulty passing urine °-signs and symptoms of kidney injury like trouble passing urine or change in the amount of urine °-signs and symptoms of liver injury like dark yellow or brown urine; general ill feeling or flu-like symptoms; light-colored stools; loss of appetite; nausea; right upper belly pain; unusually weak or tired; yellowing of the eyes or skin °-stomach pain °-vomiting °Side effects that usually do not require medical attention (report to your doctor or health care professional if they continue or are bothersome): °-headache °-joint pain °-muscle cramps or muscle pain °This list may not describe all possible side effects. Call your doctor for medical advice about side effects. You may report side effects to FDA at 1-800-FDA-1088. °Where should I keep my medicine? °This drug is given in a hospital or clinic and will not be stored at home. °NOTE: This sheet is a summary. It may not cover all possible information. If you have questions about this medicine, talk to your doctor, pharmacist, or health care provider. °© 2018 [employer]/Gold Standard (2016-05-02 15:28:09) ° °

## 2017-10-11 ENCOUNTER — Encounter (HOSPITAL_COMMUNITY): Admission: RE | Admit: 2017-10-11 | Payer: BLUE CROSS/BLUE SHIELD | Source: Ambulatory Visit

## 2017-10-30 ENCOUNTER — Ambulatory Visit (HOSPITAL_COMMUNITY)
Admission: RE | Admit: 2017-10-30 | Discharge: 2017-10-30 | Disposition: A | Discharge status: Home / Self Care | Payer: BLUE CROSS/BLUE SHIELD | Source: Ambulatory Visit | Attending: Psychiatry | Admitting: Psychiatry

## 2017-10-30 ENCOUNTER — Encounter (HOSPITAL_COMMUNITY): Payer: Self-pay

## 2017-10-30 DIAGNOSIS — G35 Multiple sclerosis: Secondary | ICD-10-CM | POA: Insufficient documentation

## 2017-10-30 MED ORDER — SODIUM CHLORIDE 0.9 % IV SOLN
INTRAVENOUS | Status: AC
  Administered 2017-10-30: 09:00:00 via INTRAVENOUS

## 2017-10-30 MED ORDER — METHYLPREDNISOLONE SODIUM SUCC 125 MG IJ SOLR
125.0000 mg | INTRAMUSCULAR | Status: AC
  Administered 2017-10-30: 125 mg via INTRAVENOUS
  Filled 2017-10-30: qty 2

## 2017-10-30 MED ORDER — SODIUM CHLORIDE 0.9 % IV SOLN
1000.0000 mg | INTRAVENOUS | Status: AC
  Administered 2017-10-30: 1000 mg via INTRAVENOUS
  Filled 2017-10-30: qty 100

## 2017-10-30 MED ORDER — ACETAMINOPHEN 325 MG PO TABS
650.0000 mg | ORAL_TABLET | ORAL | Status: AC
  Administered 2017-10-30: 650 mg via ORAL
  Filled 2017-10-30: qty 2

## 2017-10-30 MED ORDER — DIPHENHYDRAMINE HCL 50 MG/ML IJ SOLN
50.0000 mg | INTRAMUSCULAR | Status: AC
  Administered 2017-10-30: 50 mg via INTRAVENOUS
  Filled 2017-10-30: qty 1

## 2017-10-30 NOTE — Progress Notes (Signed)
Pt. Tolerated Rituxan without any problems, patient requested for infusion to go at dose 1, increased at 50 mg/hr increments.

## 2017-10-30 NOTE — Discharge Instructions (Signed)

## 2018-04-01 ENCOUNTER — Encounter (HOSPITAL_COMMUNITY)
Admission: RE | Admit: 2018-04-01 | Discharge: 2018-04-01 | Disposition: A | Discharge status: Home / Self Care | Payer: BLUE CROSS/BLUE SHIELD | Source: Ambulatory Visit | Attending: Psychiatry | Admitting: Psychiatry

## 2018-04-01 DIAGNOSIS — G35 Multiple sclerosis: Secondary | ICD-10-CM | POA: Diagnosis present

## 2018-04-01 MED ORDER — DIPHENHYDRAMINE HCL 50 MG/ML IJ SOLN
50.0000 mg | INTRAMUSCULAR | Status: DC
  Administered 2018-04-01: 50 mg via INTRAVENOUS
  Filled 2018-04-01: qty 1

## 2018-04-01 MED ORDER — SODIUM CHLORIDE 0.9 % IV SOLN
INTRAVENOUS | Status: AC
  Administered 2018-04-01: 08:00:00 via INTRAVENOUS

## 2018-04-01 MED ORDER — SODIUM CHLORIDE 0.9 % IV SOLN
1000.0000 mg | INTRAVENOUS | Status: DC
  Administered 2018-04-01: 1000 mg via INTRAVENOUS
  Filled 2018-04-01: qty 100

## 2018-04-01 MED ORDER — METHYLPREDNISOLONE SODIUM SUCC 125 MG IJ SOLR
125.0000 mg | INTRAMUSCULAR | Status: DC
  Administered 2018-04-01: 125 mg via INTRAVENOUS
  Filled 2018-04-01: qty 2

## 2018-04-01 MED ORDER — ACETAMINOPHEN 325 MG PO TABS
650.0000 mg | ORAL_TABLET | ORAL | Status: DC
  Administered 2018-04-01: 650 mg via ORAL
  Filled 2018-04-01: qty 2

## 2018-04-15 ENCOUNTER — Encounter (HOSPITAL_COMMUNITY)
Admission: RE | Admit: 2018-04-15 | Discharge: 2018-04-15 | Disposition: A | Discharge status: Home / Self Care | Payer: BLUE CROSS/BLUE SHIELD | Source: Ambulatory Visit | Attending: Psychiatry | Admitting: Psychiatry

## 2018-04-15 ENCOUNTER — Encounter (HOSPITAL_COMMUNITY): Payer: Self-pay

## 2018-04-15 DIAGNOSIS — G35 Multiple sclerosis: Secondary | ICD-10-CM | POA: Diagnosis present

## 2018-04-15 MED ORDER — DIPHENHYDRAMINE HCL 50 MG/ML IJ SOLN
50.0000 mg | INTRAMUSCULAR | Status: AC
  Administered 2018-04-15: 50 mg via INTRAVENOUS
  Filled 2018-04-15: qty 1

## 2018-04-15 MED ORDER — METHYLPREDNISOLONE SODIUM SUCC 125 MG IJ SOLR
125.0000 mg | INTRAMUSCULAR | Status: AC
  Administered 2018-04-15: 125 mg via INTRAVENOUS
  Filled 2018-04-15: qty 2

## 2018-04-15 MED ORDER — SODIUM CHLORIDE 0.9 % IV SOLN
INTRAVENOUS | Status: AC
  Administered 2018-04-15: 09:00:00 via INTRAVENOUS

## 2018-04-15 MED ORDER — SODIUM CHLORIDE 0.9 % IV SOLN
1000.0000 mg | INTRAVENOUS | Status: AC
  Administered 2018-04-15: 1000 mg via INTRAVENOUS
  Filled 2018-04-15: qty 100

## 2018-04-15 MED ORDER — ACETAMINOPHEN 325 MG PO TABS
650.0000 mg | ORAL_TABLET | ORAL | Status: AC
  Administered 2018-04-15: 650 mg via ORAL
  Filled 2018-04-15: qty 2

## 2018-04-15 NOTE — Discharge Instructions (Signed)

## 2018-07-08 ENCOUNTER — Other Ambulatory Visit: Payer: Self-pay | Admitting: Internal Medicine

## 2018-07-08 DIAGNOSIS — R1310 Dysphagia, unspecified: Secondary | ICD-10-CM

## 2018-07-11 ENCOUNTER — Ambulatory Visit
Admission: RE | Admit: 2018-07-11 | Discharge: 2018-07-11 | Disposition: A | Discharge status: Home / Self Care | Payer: BLUE CROSS/BLUE SHIELD | Source: Ambulatory Visit | Attending: Internal Medicine | Admitting: Internal Medicine

## 2018-07-11 DIAGNOSIS — R1310 Dysphagia, unspecified: Secondary | ICD-10-CM

## 2018-08-25 DIAGNOSIS — M1611 Unilateral primary osteoarthritis, right hip: Secondary | ICD-10-CM | POA: Diagnosis present

## 2018-08-25 DIAGNOSIS — G35 Multiple sclerosis: Secondary | ICD-10-CM | POA: Diagnosis present

## 2018-08-25 NOTE — H&P (Signed)
HIP ARTHROPLASTY ADMISSION H&P  Patient ID: Kendra Simon MRN: [id number] DOB/AGE: 1956/07/02 62 y.o.  Chief Complaint: right hip pain.  Planned Procedure Date: 13/3/19 Medical and Cardiac Clearance by Dr. August Saucer   Additional clearance by Neuro / PM&R: Dr. Tinnie Gens  HPI: Kendra Simon is a 62 y.o. female with a history of MS, hypothyroidism, DM, GERD, HTN, HLD, and OSA (no CPAP) who presents for evaluation of OA RIGHT HIP. The patient has a history of pain and functional disability in the right hip due to arthritis and has failed non-surgical conservative treatments for greater than 12 weeks to include NSAID's and/or analgesics, weight reduction as appropriate and activity modification.  Onset of symptoms was gradual, starting >10 years ago with gradually worsening course since that time.  Patient currently rates pain at 9 out of 10 with activity. Patient has night pain, worsening of pain with activity and weight bearing and pain that interferes with activities of daily living.  Patient has evidence of subchondral cysts, subchondral sclerosis, periarticular osteophytes and joint space narrowing by imaging studies.  There is no active infection.  Past Medical History:  Diagnosis Date  . Essential hypertension, malignant   . Hypopotassemia   . Multiple sclerosis (HCC)   . OSA (obstructive sleep apnea) 02/17/2012       . Pure hypercholesterolemia   . TIA (transient ischemic attack)   . Unspecified hypothyroidism   . Unspecified pruritic disorder    Past Surgical History:  Procedure Laterality Date  . PARTIAL HYSTERECTOMY     Allergies  Allergen Reactions  . Metformin And Related Nausea Only    Metformin ER nausea and dizziness   Prior to Admission medications   Medication Sig Start Date End Date Taking? Authorizing Provider  ACIPHEX 20 MG tablet Take 20 mg by mouth daily.  02/22/12   [provider]  ALPRAZolam Prudy Feeler) 0.5 MG tablet Take 0.5 mg by mouth 4 (four) times  daily. Take every day per patient 02/06/12   [provider]  amLODipine (NORVASC) 2.5 MG tablet Take 2.5 mg by mouth daily.  02/07/12   [provider]  amphetamine-dextroamphetamine (ADDERALL) 5 MG tablet Take 5 mg by mouth as needed. Daily as needed    [provider]  carbamazepine (CARBATROL) 300 MG 12 hr capsule Take 300 mg by mouth daily as needed. For seizure 01/10/14   [provider]  diphenhydrAMINE (SOMINEX) 25 MG tablet Take 25 mg by mouth at bedtime as needed for itching or sleep.    [provider]  Gabapentin, PHN, 300 MG TABS Take 1 tablet by mouth 3 (three) times daily.     [provider]  ibuprofen (ADVIL,MOTRIN) 800 MG tablet Take 800 mg by mouth every 6 (six) hours as needed for moderate pain.  01/30/14   [provider]  levothyroxine (SYNTHROID, LEVOTHROID) 50 MCG tablet Take 50 mcg by mouth daily.  02/18/12   [provider]  methylphenidate (RITALIN) 20 MG tablet Take 20 mg by mouth daily as needed. For tensioin span 01/22/12   [provider]  montelukast (SINGULAIR) 10 MG tablet Take 10 mg by mouth daily. 08/26/13   [provider]  potassium chloride SA (K-DUR,KLOR-CON) 20 MEQ tablet Take 20 mEq by mouth daily. 01/01/14   [provider]  PROZAC 20 MG capsule Take 20 mg by mouth 3 (three) times daily.  01/03/12   [provider]  simvastatin (ZOCOR) 20 MG tablet 20 mg daily.  02/12/12  [provider]  TECFIDERA 240 MG CPDR Take 240 mg by mouth daily. 09/07/13   [provider]  traMADol (ULTRAM) 50 MG tablet Take 1 tablet (50 mg total) by mouth 2 (two) times daily as needed. 02/03/14   Ralene Cork, DO  triamterene-hydrochlorothiazide (MAXZIDE-25) 37.5-25 MG per tablet Take 1 tablet by mouth daily.  02/06/12   [provider]   Social History: Married, non smoker.  Occasional alcohol use.  Family History  Problem Relation Age of Onset  .  Emphysema Maternal Grandfather     ROS: Currently denies lightheadedness, dizziness, Fever, chills, CP, SOB.   No personal history of DVT, PE, MI.  Remote TIA vs Sxs from MS 30+ years ago w/o residual effects. No loose teeth or dentures.  Occasional constipation.  She wears glasses. All other systems have been reviewed and were otherwise currently negative with the exception of those mentioned in the HPI and as above.  Objective: Vitals: Ht: 5'2" Wt: 178 Temp: 98.0 BP: 116/76 Pulse: 70 O2 98% on room air.   Physical Exam: General: Alert, NAD. Trendelenberg Gait  HEENT: EOMI, Good Neck Extension  Pulm: No increased work of breathing.  Clear B/L A/P w/o crackle or wheeze.  CV: RRR, No m/g/r appreciated  GI: soft, NT, ND Neuro: Neuro without gross focal deficit.  Sensation intact distally Skin: No lesions in the area of chief complaint MSK/Surgical Site: Right Hip pain with passive ROM. Limited internal rotation ROM. Positive Stinchfield.  5/5 strength.  NVI.  Sensation intact distally.  Imaging Review Plain radiographs demonstrate severe degenerative joint disease of the right hip.   Preoperative templating of the joint replacement has been completed, documented, and submitted to the Operating Room personnel in order to optimize intra-operative equipment management.  Assessment: OA RIGHT HIP Principal Problem:   Primary osteoarthritis of right hip Active Problems:   Multiple sclerosis (HCC)   Plan: Plan for Procedure(s): RIGHT TOTAL HIP ARTHROPLASTY ANTERIOR APPROACH  The patient history, physical exam, clinical judgement of the provider and imaging are consistent with end stage degenerative joint disease and total joint arthroplasty is deemed medically necessary. The treatment options including medical management, injection therapy, and arthroplasty were discussed at length. The risks and benefits of Procedure(s): RIGHT TOTAL HIP ARTHROPLASTY ANTERIOR APPROACH were presented and  reviewed.  The risks of nonoperative treatment, versus surgical intervention including but not limited to continued pain, aseptic loosening, stiffness, dislocation/subluxation, infection, bleeding, nerve injury, blood clots, cardiopulmonary complications, morbidity, mortality, among others were discussed. The patient verbalizes understanding and wishes to proceed with the plan.  Patient is being admitted for inpatient treatment for surgery, pain control, PT, OT, prophylactic antibiotics, VTE prophylaxis, progressive ambulation, ADL's and discharge planning.   Dental prophylaxis discussed and recommended for 2 years postoperatively.   The patient does meet the criteria for TXA which will be used perioperatively.    ASA 81 mg BID will be used postoperatively for DVT prophylaxis in addition to SCDs, and early ambulation.  Make Tylenol Rx post op.    Dr. Tinnie Gens will manage Pain medications.  She will continue Norco 7.5/325 mg, Baclofen, Gabapentin, Ibuprofen.  The patient is planning to be discharged home with home health services ([facility]) in care of Jorja Loa, her husband.   Albina Billet III, PA-C 08/25/2018 3:44 PM

## 2018-08-29 NOTE — Patient Instructions (Signed)
Kendra Simon  08/29/2018   Your procedure is scheduled on: 09-09-18   Report to [facility]  Entrance    Report to admitting at 5:30AM    Call this number if you have problems the morning of surgery [phone]     Remember: Do not eat food or drink liquids :After Midnight. BRUSH YOUR TEETH MORNING OF SURGERY AND RINSE YOUR MOUTH OUT, NO CHEWING GUM CANDY OR MINTS.     Take these medicines the morning of surgery with A SIP OF WATER: amlodipine, gabapentin, levothyroxine, montelukast(singulair), fluoxetine, xanax, hydrocodone if needed, levalbuterol if needed                                 You may not have any metal on your body including hair pins and              piercings  Do not wear jewelry, make-up, lotions, powders or perfumes, deodorant             Do not wear nail polish.  Do not shave  48 hours prior to surgery.            Do not bring valuables to the hospital. [facility] IS NOT             RESPONSIBLE   FOR VALUABLES.  Contacts, dentures or bridgework may not be worn into surgery.  Leave suitcase in the car. After surgery it may be brought to your room.                  Please read over the following fact sheets you were given: _____________________________________________________________________             [facility] - Preparing for Surgery Before surgery, you can play an important role.  Because skin is not sterile, your skin needs to be as free of germs as possible.  You can reduce the number of germs on your skin by washing with CHG (chlorahexidine gluconate) soap before surgery.  CHG is an antiseptic cleaner which kills germs and bonds with the skin to continue killing germs even after washing. Please DO NOT use if you have an allergy to CHG or antibacterial soaps.  If your skin becomes reddened/irritated stop using the CHG and inform your nurse when you arrive at Short Stay. Do not shave (including legs and underarms) for  at least 48 hours prior to the first CHG shower.  You may shave your face/neck. Please follow these instructions carefully:  1.  Shower with CHG Soap the night before surgery and the  morning of Surgery.  2.  If you choose to wash your hair, wash your hair first as usual with your  normal  shampoo.  3.  After you shampoo, rinse your hair and body thoroughly to remove the  shampoo.                           4.  Use CHG as you would any other liquid soap.  You can apply chg directly  to the skin and wash                       Gently with a scrungie or clean washcloth.  5.  Apply the CHG Soap to your  body ONLY FROM THE NECK DOWN.   Do not use on face/ open                           Wound or open sores. Avoid contact with eyes, ears mouth and genitals (private parts).                       Wash face,  Genitals (private parts) with your normal soap.             6.  Wash thoroughly, paying special attention to the area where your surgery  will be performed.  7.  Thoroughly rinse your body with warm water from the neck down.  8.  DO NOT shower/wash with your normal soap after using and rinsing off  the CHG Soap.                9.  Pat yourself dry with a clean towel.            10.  Wear clean pajamas.            11.  Place clean sheets on your bed the night of your first shower and do not  sleep with pets. Day of Surgery : Do not apply any lotions/deodorants the morning of surgery.  Please wear clean clothes to the hospital/surgery center.  FAILURE TO FOLLOW THESE INSTRUCTIONS MAY RESULT IN THE CANCELLATION OF YOUR SURGERY PATIENT SIGNATURE_________________________________  NURSE SIGNATURE__________________________________  ________________________________________________________________________   Adam Phenix  An incentive spirometer is a tool that can help keep your lungs clear and active. This tool measures how well you are filling your lungs with each breath. Taking long deep  breaths may help reverse or decrease the chance of developing breathing (pulmonary) problems (especially infection) following:  A long period of time when you are unable to move or be active. BEFORE THE PROCEDURE   If the spirometer includes an indicator to show your best effort, your nurse or respiratory therapist will set it to a desired goal.  If possible, sit up straight or lean slightly forward. Try not to slouch.  Hold the incentive spirometer in an upright position. INSTRUCTIONS FOR USE  1. Sit on the edge of your bed if possible, or sit up as far as you can in bed or on a chair. 2. Hold the incentive spirometer in an upright position. 3. Breathe out normally. 4. Place the mouthpiece in your mouth and seal your lips tightly around it. 5. Breathe in slowly and as deeply as possible, raising the piston or the ball toward the top of the column. 6. Hold your breath for 3-5 seconds or for as long as possible. Allow the piston or ball to fall to the bottom of the column. 7. Remove the mouthpiece from your mouth and breathe out normally. 8. Rest for a few seconds and repeat Steps 1 through 7 at least 10 times every 1-2 hours when you are awake. Take your time and take a few normal breaths between deep breaths. 9. The spirometer may include an indicator to show your best effort. Use the indicator as a goal to work toward during each repetition. 10. After each set of 10 deep breaths, practice coughing to be sure your lungs are clear. If you have an incision (the cut made at the time of surgery), support your incision when coughing by placing a pillow or rolled up towels firmly against it.  Once you are able to get out of bed, walk around indoors and cough well. You may stop using the incentive spirometer when instructed by your caregiver.  RISKS AND COMPLICATIONS  Take your time so you do not get dizzy or light-headed.  If you are in pain, you may need to take or ask for pain medication before  doing incentive spirometry. It is harder to take a deep breath if you are having pain. AFTER USE  Rest and breathe slowly and easily.  It can be helpful to keep track of a log of your progress. Your caregiver can provide you with a simple table to help with this. If you are using the spirometer at home, follow these instructions: [place] IF:   You are having difficultly using the spirometer.  You have trouble using the spirometer as often as instructed.  Your pain medication is not giving enough relief while using the spirometer.  You develop fever of 100.5 F (38.1 C) or higher. SEEK IMMEDIATE MEDICAL CARE IF:   You cough up bloody sputum that had not been present before.  You develop fever of 102 F (38.9 C) or greater.  You develop worsening pain at or near the incision site. MAKE SURE YOU:   Understand these instructions.  Will watch your condition.  Will get help right away if you are not doing well or get worse. Document Released: 02/04/2007 Document Revised: 12/17/2011 Document Reviewed: 04/07/2007 [facility] Patient Information 2014 [place], [place].   ________________________________________________________________________

## 2018-09-02 ENCOUNTER — Encounter (HOSPITAL_COMMUNITY)
Admission: RE | Admit: 2018-09-02 | Discharge: 2018-09-02 | Disposition: A | Discharge status: Home / Self Care | Payer: BLUE CROSS/BLUE SHIELD | Source: Ambulatory Visit | Attending: Orthopedic Surgery | Admitting: Orthopedic Surgery

## 2018-09-02 ENCOUNTER — Other Ambulatory Visit: Payer: Self-pay

## 2018-09-02 ENCOUNTER — Encounter (HOSPITAL_COMMUNITY): Payer: Self-pay

## 2018-09-02 DIAGNOSIS — I10 Essential (primary) hypertension: Secondary | ICD-10-CM | POA: Insufficient documentation

## 2018-09-02 DIAGNOSIS — Z01818 Encounter for other preprocedural examination: Secondary | ICD-10-CM | POA: Insufficient documentation

## 2018-09-02 DIAGNOSIS — M1611 Unilateral primary osteoarthritis, right hip: Secondary | ICD-10-CM | POA: Diagnosis not present

## 2018-09-02 HISTORY — DX: Type 2 diabetes mellitus without complications: E11.9

## 2018-09-02 HISTORY — DX: Anxiety disorder, unspecified: F41.9

## 2018-09-02 HISTORY — DX: Depression, unspecified: F32.A

## 2018-09-02 HISTORY — DX: Other complications of anesthesia, initial encounter: T88.59XA

## 2018-09-02 HISTORY — DX: Adverse effect of unspecified anesthetic, initial encounter: T41.45XA

## 2018-09-02 HISTORY — DX: Major depressive disorder, single episode, unspecified: F32.9

## 2018-09-02 LAB — BASIC METABOLIC PANEL
ANION GAP: 12 (ref 5–15)
BUN: 14 mg/dL (ref 6–20)
CO2: 31 mmol/L (ref 22–32)
CREATININE: 0.89 mg/dL (ref 0.44–1.00)
Calcium: 10 mg/dL (ref 8.9–10.3)
Chloride: 99 mmol/L (ref 98–111)
GFR calc Af Amer: >60 mL/min (ref >60)
GFR calc non Af Amer: >60 mL/min (ref >60)
GLUCOSE: 160 mg/dL — AB (ref 70–99)
POTASSIUM: 3.6 mmol/L (ref 3.5–5.1)
SODIUM: 142 mmol/L (ref 135–145)

## 2018-09-02 LAB — CBC
HEMATOCRIT: 38.6 % (ref 36.0–46.0)
HEMOGLOBIN: 12.5 g/dL (ref 12.0–15.0)
MCH: 31.3 pg (ref 26.0–34.0)
MCHC: 32.4 g/dL (ref 30.0–36.0)
MCV: 96.7 fL (ref 80.0–100.0)
Platelets: 386 10*3/uL (ref 150–400)
RBC: 3.99 MIL/uL (ref 3.87–5.11)
RDW: 12.1 % (ref 11.5–15.5)
WBC: 8.2 10*3/uL (ref 4.0–10.5)
nRBC: 0 % (ref 0.0–0.2)

## 2018-09-02 LAB — SURGICAL PCR SCREEN
MRSA, PCR: NEGATIVE
STAPHYLOCOCCUS AUREUS: NEGATIVE

## 2018-09-02 LAB — HEMOGLOBIN A1C
Hgb A1c MFr Bld: 7.3 % — ABNORMAL HIGH (ref 4.8–5.6)
Mean Plasma Glucose: 162.81 mg/dL

## 2018-09-08 MED ORDER — BUPIVACAINE LIPOSOME 1.3 % IJ SUSP
20.0000 mL | INTRAMUSCULAR | Status: DC
  Filled 2018-09-08: qty 20

## 2018-09-09 ENCOUNTER — Inpatient Hospital Stay (HOSPITAL_COMMUNITY): Payer: BLUE CROSS/BLUE SHIELD | Admitting: Anesthesiology

## 2018-09-09 ENCOUNTER — Inpatient Hospital Stay (HOSPITAL_COMMUNITY)
Admission: RE | Admit: 2018-09-09 | Discharge: 2018-09-10 | DRG: 470 | Disposition: A | Discharge status: Home Health | Payer: BLUE CROSS/BLUE SHIELD | Attending: Orthopedic Surgery | Admitting: Orthopedic Surgery

## 2018-09-09 ENCOUNTER — Encounter (HOSPITAL_COMMUNITY): Payer: Self-pay | Admitting: Anesthesiology

## 2018-09-09 ENCOUNTER — Other Ambulatory Visit: Payer: Self-pay

## 2018-09-09 ENCOUNTER — Inpatient Hospital Stay (HOSPITAL_COMMUNITY): Payer: BLUE CROSS/BLUE SHIELD

## 2018-09-09 ENCOUNTER — Encounter (HOSPITAL_COMMUNITY)
Admission: RE | Disposition: A | Discharge status: Home Health | Payer: Self-pay | Source: Home / Self Care | Attending: Orthopedic Surgery

## 2018-09-09 DIAGNOSIS — M1610 Unilateral primary osteoarthritis, unspecified hip: Secondary | ICD-10-CM | POA: Diagnosis present

## 2018-09-09 DIAGNOSIS — I10 Essential (primary) hypertension: Secondary | ICD-10-CM | POA: Diagnosis present

## 2018-09-09 DIAGNOSIS — Z888 Allergy status to other drugs, medicaments and biological substances status: Secondary | ICD-10-CM

## 2018-09-09 DIAGNOSIS — G35 Multiple sclerosis: Secondary | ICD-10-CM | POA: Diagnosis present

## 2018-09-09 DIAGNOSIS — G4733 Obstructive sleep apnea (adult) (pediatric): Secondary | ICD-10-CM | POA: Diagnosis present

## 2018-09-09 DIAGNOSIS — Z79899 Other long term (current) drug therapy: Secondary | ICD-10-CM | POA: Diagnosis not present

## 2018-09-09 DIAGNOSIS — E119 Type 2 diabetes mellitus without complications: Secondary | ICD-10-CM | POA: Diagnosis present

## 2018-09-09 DIAGNOSIS — M1611 Unilateral primary osteoarthritis, right hip: Secondary | ICD-10-CM | POA: Diagnosis present

## 2018-09-09 DIAGNOSIS — E785 Hyperlipidemia, unspecified: Secondary | ICD-10-CM | POA: Diagnosis present

## 2018-09-09 DIAGNOSIS — M25559 Pain in unspecified hip: Secondary | ICD-10-CM

## 2018-09-09 DIAGNOSIS — K219 Gastro-esophageal reflux disease without esophagitis: Secondary | ICD-10-CM | POA: Diagnosis present

## 2018-09-09 DIAGNOSIS — E039 Hypothyroidism, unspecified: Secondary | ICD-10-CM | POA: Diagnosis present

## 2018-09-09 DIAGNOSIS — G35D Multiple sclerosis, unspecified: Secondary | ICD-10-CM | POA: Diagnosis present

## 2018-09-09 DIAGNOSIS — Z8673 Personal history of transient ischemic attack (TIA), and cerebral infarction without residual deficits: Secondary | ICD-10-CM | POA: Diagnosis not present

## 2018-09-09 DIAGNOSIS — Z90711 Acquired absence of uterus with remaining cervical stump: Secondary | ICD-10-CM | POA: Diagnosis not present

## 2018-09-09 DIAGNOSIS — Z79890 Hormone replacement therapy (postmenopausal): Secondary | ICD-10-CM | POA: Diagnosis not present

## 2018-09-09 HISTORY — PX: TOTAL HIP ARTHROPLASTY: SHX124

## 2018-09-09 LAB — GLUCOSE, CAPILLARY
Glucose-Capillary: 139 mg/dL — ABNORMAL HIGH (ref 70–99)
Glucose-Capillary: 196 mg/dL — ABNORMAL HIGH (ref 70–99)
Glucose-Capillary: 212 mg/dL — ABNORMAL HIGH (ref 70–99)
Glucose-Capillary: 268 mg/dL — ABNORMAL HIGH (ref 70–99)

## 2018-09-09 SURGERY — ARTHROPLASTY, HIP, TOTAL, ANTERIOR APPROACH
Anesthesia: General | Site: Hip | Laterality: Right

## 2018-09-09 MED ORDER — 0.9 % SODIUM CHLORIDE (POUR BTL) OPTIME
TOPICAL | Status: DC | PRN
  Administered 2018-09-09: 1000 mL

## 2018-09-09 MED ORDER — LEVALBUTEROL TARTRATE 45 MCG/ACT IN AERO: 1.0000 | INHALATION_SPRAY | Freq: Three times a day (TID) | RESPIRATORY_TRACT | Status: DC | PRN

## 2018-09-09 MED ORDER — FENTANYL CITRATE (PF) 100 MCG/2ML IJ SOLN
INTRAMUSCULAR | Status: AC
  Filled 2018-09-09: qty 2

## 2018-09-09 MED ORDER — FLUOXETINE HCL 20 MG PO CAPS
40.0000 mg | ORAL_CAPSULE | Freq: Every day | ORAL | Status: DC
  Filled 2018-09-09 (×2): qty 2

## 2018-09-09 MED ORDER — SODIUM CHLORIDE (PF) 0.9 % IJ SOLN
INTRAMUSCULAR | Status: AC
  Filled 2018-09-09: qty 20

## 2018-09-09 MED ORDER — ONDANSETRON HCL 4 MG/2ML IJ SOLN: 4.0000 mg | Freq: Four times a day (QID) | INTRAMUSCULAR | Status: DC | PRN

## 2018-09-09 MED ORDER — BUPIVACAINE LIPOSOME 1.3 % IJ SUSP
INTRAMUSCULAR | Status: DC | PRN
  Administered 2018-09-09: 20 mL

## 2018-09-09 MED ORDER — LACTATED RINGERS IV SOLN
INTRAVENOUS | Status: DC
  Administered 2018-09-09 (×2): via INTRAVENOUS

## 2018-09-09 MED ORDER — SODIUM CHLORIDE FLUSH 0.9 % IV SOLN
INTRAVENOUS | Status: DC | PRN
  Administered 2018-09-09: 20 mL

## 2018-09-09 MED ORDER — LIDOCAINE 2% (20 MG/ML) 5 ML SYRINGE
INTRAMUSCULAR | Status: DC | PRN
  Administered 2018-09-09: 100 mg via INTRAVENOUS

## 2018-09-09 MED ORDER — GLYCOPYRROLATE PF 0.2 MG/ML IJ SOSY
PREFILLED_SYRINGE | INTRAMUSCULAR | Status: DC | PRN
  Administered 2018-09-09: .2 mg via INTRAVENOUS

## 2018-09-09 MED ORDER — DEXAMETHASONE SODIUM PHOSPHATE 10 MG/ML IJ SOLN
10.0000 mg | Freq: Once | INTRAMUSCULAR | Status: AC
  Administered 2018-09-10: 10 mg via INTRAVENOUS
  Filled 2018-09-09: qty 1

## 2018-09-09 MED ORDER — CHLORHEXIDINE GLUCONATE 4 % EX LIQD: 60.0000 mL | Freq: Once | CUTANEOUS | Status: DC

## 2018-09-09 MED ORDER — TRANEXAMIC ACID-NACL 1000-0.7 MG/100ML-% IV SOLN
1000.0000 mg | INTRAVENOUS | Status: AC
  Administered 2018-09-09: 1000 mg via INTRAVENOUS
  Filled 2018-09-09: qty 100

## 2018-09-09 MED ORDER — PROPOFOL 10 MG/ML IV BOLUS
INTRAVENOUS | Status: DC | PRN
  Administered 2018-09-09: 150 mg via INTRAVENOUS

## 2018-09-09 MED ORDER — FENTANYL CITRATE (PF) 100 MCG/2ML IJ SOLN
INTRAMUSCULAR | Status: DC | PRN
  Administered 2018-09-09 (×4): 25 ug via INTRAVENOUS
  Administered 2018-09-09: 75 ug via INTRAVENOUS
  Administered 2018-09-09: 25 ug via INTRAVENOUS

## 2018-09-09 MED ORDER — ONDANSETRON HCL 4 MG PO TABS: 4.0000 mg | ORAL_TABLET | Freq: Four times a day (QID) | ORAL | Status: DC | PRN

## 2018-09-09 MED ORDER — POLYETHYLENE GLYCOL 3350 17 G PO PACK: 17.0000 g | PACK | Freq: Every day | ORAL | Status: DC | PRN

## 2018-09-09 MED ORDER — SORBITOL 70 % SOLN
30.0000 mL | Freq: Every day | Status: DC | PRN
  Filled 2018-09-09: qty 30

## 2018-09-09 MED ORDER — LINACLOTIDE 145 MCG PO CAPS
290.0000 ug | ORAL_CAPSULE | Freq: Every day | ORAL | Status: DC
  Administered 2018-09-10: 290 ug via ORAL
  Filled 2018-09-09: qty 2

## 2018-09-09 MED ORDER — GLYCOPYRROLATE PF 0.2 MG/ML IJ SOSY
PREFILLED_SYRINGE | INTRAMUSCULAR | Status: AC
  Filled 2018-09-09: qty 1

## 2018-09-09 MED ORDER — OXYBUTYNIN CHLORIDE 5 MG PO TABS: 5.0000 mg | ORAL_TABLET | Freq: Three times a day (TID) | ORAL | Status: DC | PRN

## 2018-09-09 MED ORDER — GABAPENTIN 300 MG PO CAPS
900.0000 mg | ORAL_CAPSULE | Freq: Four times a day (QID) | ORAL | Status: DC
  Administered 2018-09-09 – 2018-09-10 (×4): 900 mg via ORAL
  Filled 2018-09-09 (×4): qty 3

## 2018-09-09 MED ORDER — TRIAMTERENE-HCTZ 37.5-25 MG PO TABS
1.0000 | ORAL_TABLET | Freq: Every day | ORAL | Status: DC
  Administered 2018-09-09 – 2018-09-10 (×2): 1 via ORAL
  Filled 2018-09-09 (×2): qty 1

## 2018-09-09 MED ORDER — MIDAZOLAM HCL 2 MG/2ML IJ SOLN
INTRAMUSCULAR | Status: AC
  Filled 2018-09-09: qty 2

## 2018-09-09 MED ORDER — LIDOCAINE 2% (20 MG/ML) 5 ML SYRINGE
INTRAMUSCULAR | Status: AC
  Filled 2018-09-09: qty 5

## 2018-09-09 MED ORDER — METOCLOPRAMIDE HCL 5 MG PO TABS: 5.0000 mg | ORAL_TABLET | Freq: Three times a day (TID) | ORAL | Status: DC | PRN

## 2018-09-09 MED ORDER — LEVALBUTEROL HCL 0.63 MG/3ML IN NEBU: 0.6300 mg | INHALATION_SOLUTION | Freq: Three times a day (TID) | RESPIRATORY_TRACT | Status: DC | PRN

## 2018-09-09 MED ORDER — KETOROLAC TROMETHAMINE 15 MG/ML IJ SOLN
15.0000 mg | Freq: Four times a day (QID) | INTRAMUSCULAR | Status: DC
  Administered 2018-09-09 – 2018-09-10 (×5): 15 mg via INTRAVENOUS
  Filled 2018-09-09 (×5): qty 1

## 2018-09-09 MED ORDER — TEMAZEPAM 15 MG PO CAPS: 15.0000 mg | ORAL_CAPSULE | Freq: Every evening | ORAL | Status: DC | PRN

## 2018-09-09 MED ORDER — ACETAMINOPHEN 500 MG PO TABS
1000.0000 mg | ORAL_TABLET | Freq: Once | ORAL | Status: AC
  Administered 2018-09-09: 1000 mg via ORAL
  Filled 2018-09-09: qty 2

## 2018-09-09 MED ORDER — FENTANYL CITRATE (PF) 100 MCG/2ML IJ SOLN
25.0000 ug | INTRAMUSCULAR | Status: DC | PRN
  Administered 2018-09-09 (×2): 25 ug via INTRAVENOUS

## 2018-09-09 MED ORDER — SUGAMMADEX SODIUM 200 MG/2ML IV SOLN: INTRAVENOUS | Status: DC | PRN

## 2018-09-09 MED ORDER — AMLODIPINE BESYLATE 5 MG PO TABS
5.0000 mg | ORAL_TABLET | Freq: Every day | ORAL | Status: DC
  Administered 2018-09-09 – 2018-09-10 (×2): 5 mg via ORAL
  Filled 2018-09-09 (×2): qty 1

## 2018-09-09 MED ORDER — ASPIRIN 81 MG PO CHEW
81.0000 mg | CHEWABLE_TABLET | Freq: Two times a day (BID) | ORAL | Status: DC
  Administered 2018-09-09 – 2018-09-10 (×2): 81 mg via ORAL
  Filled 2018-09-09 (×3): qty 1

## 2018-09-09 MED ORDER — SUGAMMADEX SODIUM 200 MG/2ML IV SOLN
INTRAVENOUS | Status: AC
  Filled 2018-09-09: qty 2

## 2018-09-09 MED ORDER — ONDANSETRON HCL 4 MG/2ML IJ SOLN
INTRAMUSCULAR | Status: DC | PRN
  Administered 2018-09-09: 4 mg via INTRAVENOUS

## 2018-09-09 MED ORDER — DEXAMETHASONE SODIUM PHOSPHATE 10 MG/ML IJ SOLN
INTRAMUSCULAR | Status: DC | PRN
  Administered 2018-09-09: 10 mg via INTRAVENOUS

## 2018-09-09 MED ORDER — DOCUSATE SODIUM 100 MG PO CAPS
100.0000 mg | ORAL_CAPSULE | Freq: Two times a day (BID) | ORAL | Status: DC
  Administered 2018-09-09 – 2018-09-10 (×2): 100 mg via ORAL
  Filled 2018-09-09 (×2): qty 1

## 2018-09-09 MED ORDER — MAGNESIUM CITRATE PO SOLN: 1.0000 | Freq: Once | ORAL | Status: DC | PRN

## 2018-09-09 MED ORDER — AMPHETAMINE-DEXTROAMPHETAMINE 20 MG PO TABS
20.0000 mg | ORAL_TABLET | Freq: Two times a day (BID) | ORAL | Status: DC
  Administered 2018-09-09: 20 mg via ORAL
  Filled 2018-09-09 (×2): qty 1

## 2018-09-09 MED ORDER — CEFAZOLIN SODIUM-DEXTROSE 1-4 GM/50ML-% IV SOLN
1.0000 g | Freq: Four times a day (QID) | INTRAVENOUS | Status: AC
  Administered 2018-09-09 (×2): 1 g via INTRAVENOUS
  Filled 2018-09-09 (×2): qty 50

## 2018-09-09 MED ORDER — MENTHOL 3 MG MT LOZG: 1.0000 | LOZENGE | OROMUCOSAL | Status: DC | PRN

## 2018-09-09 MED ORDER — CEFAZOLIN SODIUM-DEXTROSE 2-4 GM/100ML-% IV SOLN
2.0000 g | INTRAVENOUS | Status: AC
  Administered 2018-09-09: 2 g via INTRAVENOUS
  Filled 2018-09-09: qty 100

## 2018-09-09 MED ORDER — ALPRAZOLAM 0.5 MG PO TABS
0.5000 mg | ORAL_TABLET | Freq: Four times a day (QID) | ORAL | Status: DC
  Administered 2018-09-09 – 2018-09-10 (×3): 0.5 mg via ORAL
  Filled 2018-09-09 (×3): qty 1

## 2018-09-09 MED ORDER — SIMVASTATIN 20 MG PO TABS
20.0000 mg | ORAL_TABLET | Freq: Every day | ORAL | Status: DC
  Administered 2018-09-09: 20 mg via ORAL
  Filled 2018-09-09: qty 1

## 2018-09-09 MED ORDER — ROCURONIUM BROMIDE 10 MG/ML (PF) SYRINGE
PREFILLED_SYRINGE | INTRAVENOUS | Status: DC | PRN
  Administered 2018-09-09: 50 mg via INTRAVENOUS

## 2018-09-09 MED ORDER — SUGAMMADEX SODIUM 200 MG/2ML IV SOLN
INTRAVENOUS | Status: DC | PRN
  Administered 2018-09-09: 200 mg via INTRAVENOUS

## 2018-09-09 MED ORDER — DOCUSATE SODIUM 100 MG PO CAPS: 100.0000 mg | ORAL_CAPSULE | Freq: Two times a day (BID) | ORAL | 0 refills | Status: AC

## 2018-09-09 MED ORDER — DEXAMETHASONE SODIUM PHOSPHATE 10 MG/ML IJ SOLN
INTRAMUSCULAR | Status: AC
  Filled 2018-09-09: qty 3

## 2018-09-09 MED ORDER — POLYVINYL ALCOHOL 1.4 % OP SOLN
1.0000 [drp] | OPHTHALMIC | Status: DC | PRN
  Filled 2018-09-09: qty 15

## 2018-09-09 MED ORDER — BACLOFEN 10 MG PO TABS: 10.0000 mg | ORAL_TABLET | Freq: Three times a day (TID) | ORAL | Status: DC | PRN

## 2018-09-09 MED ORDER — PROPOFOL 10 MG/ML IV BOLUS
INTRAVENOUS | Status: AC
  Filled 2018-09-09: qty 80

## 2018-09-09 MED ORDER — ACETAMINOPHEN 325 MG PO TABS: 325.0000 mg | ORAL_TABLET | Freq: Four times a day (QID) | ORAL | Status: DC | PRN

## 2018-09-09 MED ORDER — MORPHINE SULFATE (PF) 2 MG/ML IV SOLN
0.5000 mg | INTRAVENOUS | Status: DC | PRN
  Administered 2018-09-09: 0.5 mg via INTRAVENOUS
  Filled 2018-09-09: qty 1

## 2018-09-09 MED ORDER — MIDAZOLAM HCL 5 MG/5ML IJ SOLN
INTRAMUSCULAR | Status: DC | PRN
  Administered 2018-09-09: 2 mg via INTRAVENOUS

## 2018-09-09 MED ORDER — ASPIRIN EC 81 MG PO TBEC: 81.0000 mg | DELAYED_RELEASE_TABLET | Freq: Two times a day (BID) | ORAL | 0 refills | Status: AC

## 2018-09-09 MED ORDER — EPHEDRINE 5 MG/ML INJ
INTRAVENOUS | Status: AC
  Filled 2018-09-09: qty 10

## 2018-09-09 MED ORDER — ACETAMINOPHEN 500 MG PO TABS: 1000.0000 mg | ORAL_TABLET | Freq: Three times a day (TID) | ORAL | 0 refills | Status: AC

## 2018-09-09 MED ORDER — PHENOL 1.4 % MT LIQD: 1.0000 | OROMUCOSAL | Status: DC | PRN

## 2018-09-09 MED ORDER — WATER FOR IRRIGATION, STERILE IR SOLN
Status: DC | PRN
  Administered 2018-09-09: 2000 mL

## 2018-09-09 MED ORDER — GABAPENTIN 300 MG PO CAPS
300.0000 mg | ORAL_CAPSULE | Freq: Once | ORAL | Status: DC
  Filled 2018-09-09: qty 1

## 2018-09-09 MED ORDER — ROCURONIUM BROMIDE 100 MG/10ML IV SOLN
INTRAVENOUS | Status: AC
  Filled 2018-09-09: qty 2

## 2018-09-09 MED ORDER — DIPHENHYDRAMINE HCL 12.5 MG/5ML PO ELIX: 12.5000 mg | ORAL_SOLUTION | ORAL | Status: DC | PRN

## 2018-09-09 MED ORDER — POVIDONE-IODINE 10 % EX SWAB
2.0000 "application " | Freq: Once | CUTANEOUS | Status: AC
  Administered 2018-09-09: 2 via TOPICAL

## 2018-09-09 MED ORDER — MONTELUKAST SODIUM 10 MG PO TABS
10.0000 mg | ORAL_TABLET | Freq: Every day | ORAL | Status: DC
  Administered 2018-09-10: 10 mg via ORAL
  Filled 2018-09-09: qty 1

## 2018-09-09 MED ORDER — EPHEDRINE SULFATE-NACL 50-0.9 MG/10ML-% IV SOSY
PREFILLED_SYRINGE | INTRAVENOUS | Status: DC | PRN
  Administered 2018-09-09 (×4): 5 mg via INTRAVENOUS

## 2018-09-09 MED ORDER — PANTOPRAZOLE SODIUM 40 MG PO TBEC
40.0000 mg | DELAYED_RELEASE_TABLET | Freq: Every day | ORAL | Status: DC
  Administered 2018-09-09 – 2018-09-10 (×2): 40 mg via ORAL
  Filled 2018-09-09 (×2): qty 1

## 2018-09-09 MED ORDER — HYDROCODONE-ACETAMINOPHEN 7.5-325 MG PO TABS
1.0000 | ORAL_TABLET | ORAL | Status: DC | PRN
  Administered 2018-09-09 – 2018-09-10 (×4): 1 via ORAL
  Filled 2018-09-09 (×4): qty 1

## 2018-09-09 MED ORDER — INSULIN ASPART 100 UNIT/ML ~~LOC~~ SOLN
0.0000 [IU] | Freq: Three times a day (TID) | SUBCUTANEOUS | Status: DC
  Administered 2018-09-09: 5 [IU] via SUBCUTANEOUS
  Administered 2018-09-09: 8 [IU] via SUBCUTANEOUS
  Administered 2018-09-10: 3 [IU] via SUBCUTANEOUS
  Administered 2018-09-10: 5 [IU] via SUBCUTANEOUS

## 2018-09-09 MED ORDER — PHENYLEPHRINE HCL 10 MG/ML IJ SOLN
INTRAMUSCULAR | Status: AC
  Filled 2018-09-09: qty 1

## 2018-09-09 MED ORDER — LEVOTHYROXINE SODIUM 50 MCG PO TABS
50.0000 ug | ORAL_TABLET | Freq: Every day | ORAL | Status: DC
  Administered 2018-09-10: 50 ug via ORAL
  Filled 2018-09-09: qty 1

## 2018-09-09 MED ORDER — LACTATED RINGERS IV SOLN
INTRAVENOUS | Status: DC
  Administered 2018-09-09: 22:00:00 via INTRAVENOUS
  Administered 2018-09-09: 125 mL/h via INTRAVENOUS
  Administered 2018-09-09 – 2018-09-10 (×2): via INTRAVENOUS

## 2018-09-09 MED ORDER — METOCLOPRAMIDE HCL 5 MG/ML IJ SOLN: 5.0000 mg | Freq: Three times a day (TID) | INTRAMUSCULAR | Status: DC | PRN

## 2018-09-09 MED ORDER — POLYETHYL GLYCOL-PROPYL GLYCOL 0.4-0.3 % OP SOLN: 1.0000 [drp] | Freq: Every day | OPHTHALMIC | Status: DC

## 2018-09-09 MED ORDER — ONDANSETRON HCL 4 MG PO TABS: 4.0000 mg | ORAL_TABLET | Freq: Three times a day (TID) | ORAL | 0 refills | Status: AC | PRN

## 2018-09-09 MED ORDER — ONDANSETRON HCL 4 MG/2ML IJ SOLN
INTRAMUSCULAR | Status: AC
  Filled 2018-09-09: qty 6

## 2018-09-09 SURGICAL SUPPLY — 36 items
BLADE SAG 18X100X1.27 (BLADE) ×2 IMPLANT
CLOSURE WOUND 1/2 X4 (GAUZE/BANDAGES/DRESSINGS) ×1
COVER PERINEAL POST (MISCELLANEOUS) ×3 IMPLANT
COVER SURGICAL LIGHT HANDLE (MISCELLANEOUS) ×3 IMPLANT
COVER WAND RF STERILE (DRAPES) IMPLANT
DECANTER SPIKE VIAL GLASS SM (MISCELLANEOUS) ×6 IMPLANT
DRAPE IMP U-DRAPE 54X76 (DRAPES) ×3 IMPLANT
DRAPE STERI IOBAN 125X83 (DRAPES) ×3 IMPLANT
DRAPE U-SHAPE 47X51 STRL (DRAPES) ×6 IMPLANT
DRSG MEPILEX BORDER 4X8 (GAUZE/BANDAGES/DRESSINGS) ×3 IMPLANT
DURAPREP 26ML APPLICATOR (WOUND CARE) ×3 IMPLANT
GLOVE BIO SURGEON STRL SZ7.5 (GLOVE) ×6 IMPLANT
GLOVE BIOGEL PI IND STRL 8 (GLOVE) ×2 IMPLANT
GLOVE BIOGEL PI INDICATOR 8 (GLOVE) ×4
GOWN STRL REUS W/TWL LRG LVL3 (GOWN DISPOSABLE) ×3 IMPLANT
GOWN STRL REUS W/TWL XL LVL3 (GOWN DISPOSABLE) ×3 IMPLANT
HEAD CERAMIC FEMORAL 36MM (Head) ×2 IMPLANT
INSERT POLYETHYLENE 36M-0 (Insert) ×2 IMPLANT
MANIFOLD NEPTUNE II (INSTRUMENTS) ×3 IMPLANT
NS IRRIG 1000ML POUR BTL (IV SOLUTION) ×3 IMPLANT
PACK ANTERIOR HIP CUSTOM (KITS) ×3 IMPLANT
PROTECTOR NERVE ULNAR (MISCELLANEOUS) ×3 IMPLANT
SCREW HEX LP 6.5X20 (Screw) ×2 IMPLANT
SHELL TRIDENT II CLUST 50 (Shell) ×2 IMPLANT
STEM HIP 127 DEG (Stem) ×2 IMPLANT
STRIP CLOSURE SKIN 1/2X4 (GAUZE/BANDAGES/DRESSINGS) ×2 IMPLANT
SUT MNCRL AB 4-0 PS2 18 (SUTURE) ×3 IMPLANT
SUT STRATAFIX 0 PDS 27 VIOLET (SUTURE) ×3
SUT VIC AB 0 CT1 36 (SUTURE) ×3 IMPLANT
SUT VIC AB 1 CT1 36 (SUTURE) ×3 IMPLANT
SUT VIC AB 2-0 CT1 27 (SUTURE) ×6
SUT VIC AB 2-0 CT1 TAPERPNT 27 (SUTURE) ×2 IMPLANT
SUTURE STRATFX 0 PDS 27 VIOLET (SUTURE) ×1 IMPLANT
TRAY FOLEY CATH 14FR (SET/KITS/TRAYS/PACK) ×2 IMPLANT
WATER STERILE IRR 1000ML POUR (IV SOLUTION) ×6 IMPLANT
YANKAUER SUCT BULB TIP 10FT TU (MISCELLANEOUS) ×3 IMPLANT

## 2018-09-09 NOTE — Anesthesia Procedure Notes (Signed)
Procedure Name: Intubation Date/Time: 09/09/2018 8:00 AM Performed by: Lavina Hamman, CRNA Pre-anesthesia Checklist: Patient identified, Emergency Drugs available, Suction available, Patient being monitored and Timeout performed Patient Re-evaluated:Patient Re-evaluated prior to induction Oxygen Delivery Method: Circle system utilized Preoxygenation: Pre-oxygenation with 100% oxygen Induction Type: IV induction Ventilation: Mask ventilation without difficulty Laryngoscope Size: Mac and 3 Grade View: Grade II Tube type: Oral Tube size: 7.0 mm Number of attempts: 1 Airway Equipment and Method: Stylet Placement Confirmation: ETT inserted through vocal cords under direct vision,  positive ETCO2,  CO2 detector and breath sounds checked- equal and bilateral Secured at: 22 cm Tube secured with: Tape Dental Injury: Teeth and Oropharynx as per pre-operative assessment  Comments: ATOI

## 2018-09-09 NOTE — Evaluation (Addendum)
Physical Therapy Evaluation Patient Details Name: Kendra Simon MRN: [id number] DOB: 1956-08-21 Today's Date: 09/09/2018   History of Present Illness  61 y.o. female with a history of MS, hypothyroidism, DM, GERD, HTN, HLD, and OSA (no CPAP) admitted for R THA-DA  Clinical Impression  Pt is s/p THA resulting in the deficits listed below (see PT Problem List). Pt ambulated 15' with RW, no loss of balance, distance limited by pain. Good progress expected.  Pt will benefit from skilled PT to increase their independence and safety with mobility to allow discharge to the venue listed below.      Follow Up Recommendations Follow surgeon's recommendation for DC plan and follow-up therapies;Supervision for mobility/OOB(HHPT per pt)    Equipment Recommendations  Rolling walker with 5" wheels ; 3 in 1 bedside commode   Recommendations for Other Services       Precautions / Restrictions Precautions Precautions: Fall Precaution Comments: pt denies h/o falls in past 1 year Restrictions Weight Bearing Restrictions: No Other Position/Activity Restrictions: WBAT      Mobility  Bed Mobility Overal bed mobility: Modified Independent             General bed mobility comments: HOB up  Transfers Overall transfer level: Needs assistance Equipment used: Rolling walker (2 wheeled) Transfers: Sit to/from Stand Sit to Stand: Min assist         General transfer comment: VCs hand placement , min A to rise  Ambulation/Gait Ambulation/Gait assistance: Min guard Gait Distance (Feet): 15 Feet Assistive device: Rolling walker (2 wheeled) Gait Pattern/deviations: Step-to pattern Gait velocity: decr   General Gait Details: VCs sequencing, no loss of balance  Stairs            Wheelchair Mobility    Modified Rankin (Stroke Patients Only)       Balance Overall balance assessment: Modified Independent                                           Pertinent  Vitals/Pain Pain Assessment: 0-10 Pain Score: 4  Pain Location: R hip Pain Descriptors / Indicators: Sore Pain Intervention(s): Limited activity within patient's tolerance;Monitored during session;Premedicated before session;Ice applied    Home Living Family/patient expects to be discharged to:: Private residence Living Arrangements: Spouse/significant other     Home Access: Stairs to enter Entrance Stairs-Rails: None Entrance Stairs-Number of Steps: 1 Home Layout: One level Home Equipment: Cane - single point;Walker - 4 wheels;Shower seat - built in      Prior Function Level of Independence: Independent with assistive device(s)         Comments: used rollator and cane     Hand Dominance        Extremity/Trunk Assessment   Upper Extremity Assessment Upper Extremity Assessment: Overall WFL for tasks assessed    Lower Extremity Assessment Lower Extremity Assessment: RLE deficits/detail RLE Deficits / Details: knee ext +3/5, hip flexion AAROM 40*, hip aBDuction AAROM 15* RLE Sensation: WNL    Cervical / Trunk Assessment Cervical / Trunk Assessment: Normal  Communication      Cognition Arousal/Alertness: Awake/alert Behavior During Therapy: WFL for tasks assessed/performed Overall Cognitive Status: Within Functional Limits for tasks assessed  General Comments      Exercises Total Joint Exercises Ankle Circles/Pumps: AROM;Both;10 reps Heel Slides: AAROM;Right;10 reps;Supine Hip ABduction/ADduction: AAROM;Right;10 reps;Supine   Assessment/Plan    PT Assessment Patient needs continued PT services  PT Problem List Decreased strength;Decreased range of motion;Decreased activity tolerance;Pain;Decreased mobility;Decreased knowledge of use of DME       PT Treatment Interventions DME instruction;Gait training;Stair training;Functional mobility training;Therapeutic activities;Therapeutic  exercise;Patient/family education    PT Goals (Current goals can be found in the Care Plan section)  Acute Rehab PT Goals Patient Stated Goal: walk with cane PT Goal Formulation: With patient/family Time For Goal Achievement: 09/16/18 Potential to Achieve Goals: Good    Frequency 7X/week   Barriers to discharge        Co-evaluation               AM-PAC PT "6 Clicks" Mobility  Outcome Measure Help needed turning from your back to your side while in a flat bed without using bedrails?: A Little Help needed moving from lying on your back to sitting on the side of a flat bed without using bedrails?: A Little Help needed moving to and from a bed to a chair (including a wheelchair)?: A Little Help needed standing up from a chair using your arms (e.g., wheelchair or bedside chair)?: A Little Help needed to walk in hospital room?: A Little Help needed climbing 3-5 steps with a railing? : A Lot 6 Click Score: 17    End of Session Equipment Utilized During Treatment: Gait belt Activity Tolerance: Patient tolerated treatment well Patient left: in chair;with call bell/phone within reach;with family/visitor present Nurse Communication: Mobility status PT Visit Diagnosis: Difficulty in walking, not elsewhere classified (R26.2);Pain;Muscle weakness (generalized) (M62.81) Pain - Right/Left: Right Pain - part of body: Hip    Time: 3606-7703 PT Time Calculation (min) (ACUTE ONLY): 24 min   Charges:   PT Evaluation $PT Eval Low Complexity: 1 Low PT Treatments $Gait Training: 8-22 mins        Ralene Bathe Kistler PT 09/09/2018  Acute Rehabilitation Services Pager [phone] Office [phone]

## 2018-09-09 NOTE — Interval H&P Note (Signed)
I participated in the care of this patient and agree with the above history, physical and evaluation. I performed a review of the history and a physical exam as detailed   Kendra Simon Kendra Ahmere Hemenway MD  

## 2018-09-09 NOTE — Discharge Instructions (Signed)

## 2018-09-09 NOTE — Anesthesia Postprocedure Evaluation (Signed)
Anesthesia Post Note  Patient: Kendra Simon  Procedure(s) Performed: RIGHT TOTAL HIP ARTHROPLASTY ANTERIOR APPROACH (Right Hip)     Patient location during evaluation: PACU Anesthesia Type: General Level of consciousness: awake and alert Pain management: pain level controlled Vital Signs Assessment: post-procedure vital signs reviewed and stable Respiratory status: spontaneous breathing, nonlabored ventilation, respiratory function stable and patient connected to nasal cannula oxygen Cardiovascular status: blood pressure returned to baseline and stable Postop Assessment: no apparent nausea or vomiting Anesthetic complications: no    Last Vitals:  Vitals:   09/09/18 1131 09/09/18 1220  BP: 126/62 123/69  Pulse: 62 (!) 58  Resp: 16 18  Temp: (!) 36.4 C (!) 36.3 C  SpO2: 97% 100%    Last Pain:  Vitals:   09/09/18 1220  TempSrc: Oral  PainSc:                  Okechukwu Regnier L Veryl Winemiller

## 2018-09-09 NOTE — Anesthesia Preprocedure Evaluation (Addendum)
Anesthesia Evaluation  Patient identified by MRN, date of birth, ID band Patient awake    Reviewed: Allergy & Precautions, NPO status , Patient's Chart, lab work & pertinent test results  Airway Mallampati: III  TM Distance: >3 FB Neck ROM: Full    Dental no notable dental hx. (+) Teeth Intact, Dental Advisory Given   Pulmonary asthma , sleep apnea (no CPAP) ,    Pulmonary exam normal breath sounds clear to auscultation       Cardiovascular hypertension, Pt. on medications negative cardio ROS Normal cardiovascular exam Rhythm:Regular Rate:Normal     Neuro/Psych PSYCHIATRIC DISORDERS Anxiety Depression MS TIA   GI/Hepatic Neg liver ROS, GERD  Medicated,  Endo/Other  diabetesHypothyroidism   Renal/GU negative Renal ROS  negative genitourinary   Musculoskeletal  (+) Arthritis , Osteoarthritis,    Abdominal   Peds  (+) ADHD Hematology negative hematology ROS (+)   Anesthesia Other Findings   Reproductive/Obstetrics                            Anesthesia Physical Anesthesia Plan  ASA: III  Anesthesia Plan: General   Post-op Pain Management:    Induction: Intravenous  PONV Risk Score and Plan: 3 and Treatment may vary due to age or medical condition, Ondansetron, Dexamethasone and Midazolam  Airway Management Planned: Oral ETT  Additional Equipment:   Intra-op Plan:   Post-operative Plan: Extubation in OR  Informed Consent: I have reviewed the patients History and Physical, chart, labs and discussed the procedure including the risks, benefits and alternatives for the proposed anesthesia with the patient or authorized representative who has indicated his/her understanding and acceptance.   Dental advisory given  Plan Discussed with: CRNA  Anesthesia Plan Comments:         Anesthesia Quick Evaluation

## 2018-09-09 NOTE — Op Note (Signed)
09/09/2018  8:48 AM  PATIENT:  Kendra Simon   MRN: [id number]  PRE-OPERATIVE DIAGNOSIS:  OA RIGHT HIP  POST-OPERATIVE DIAGNOSIS:  OA RIGHT HIP  PROCEDURE:  Procedure(s): RIGHT TOTAL HIP ARTHROPLASTY ANTERIOR APPROACH  PREOPERATIVE INDICATIONS:    Kendra Simon is an 62 y.o. female who has a diagnosis of Primary osteoarthritis of right hip and elected for surgical management after failing conservative treatment.  The risks benefits and alternatives were discussed with the patient including but not limited to the risks of nonoperative treatment, versus surgical intervention including infection, bleeding, nerve injury, periprosthetic fracture, the need for revision surgery, dislocation, leg length discrepancy, blood clots, cardiopulmonary complications, morbidity, mortality, among others, and they were willing to proceed.     OPERATIVE REPORT     SURGEON:   Renette Butters, MD    ASSISTANT:  Roxan Hockey, PA-C, he was present and scrubbed throughout the case, critical for completion in a timely fashion, and for retraction, instrumentation, and closure.     ANESTHESIA:  General    COMPLICATIONS:  None.     COMPONENTS:  [employer]ker acolade fit femur size 5 with a 36 mm -0 head ball and a PSL acetabular shell size 50 with a  polyethylene liner    PROCEDURE IN DETAIL:   The patient was met in the holding area and  identified.  The appropriate hip was identified and marked at the operative site.  The patient was then transported to the OR  and  placed under anesthesia per that record.  At that point, the patient was  placed in the supine position and  secured to the operating room table and all bony prominences padded. He received pre-operative antibiotics    The operative lower extremity was prepped from the iliac crest to the distal leg.  Sterile draping was performed.  Time out was performed prior to incision.      Skin incision was made just 2 cm lateral to the ASIS  extending  in line with the tensor fascia lata. Electrocautery was used to control all bleeders. I dissected down sharply to the fascia of the tensor fascia lata was confirmed that the muscle fibers beneath were running posteriorly. I then incised the fascia over the superficial tensor fascia lata in line with the incision. The fascia was elevated off the anterior aspect of the muscle the muscle was retracted posteriorly and protected throughout the case. I then used electrocautery to incise the tensor fascia lata fascia control and all bleeders. Immediately visible was the fat over top of the anterior neck and capsule.  I removed the anterior fat from the capsule and elevated the rectus muscle off of the anterior capsule. I then removed a large time of capsule. The retractors were then placed over the anterior acetabulum as well as around the superior and inferior neck.  I then removed a section of the femoral neck and a napkin ring fashion. Then used the power course to remove the femoral head from the acetabulum and thoroughly irrigated the acetabulum. I sized the femoral head.    I then exposed the deep acetabulum, cleared out any tissue including the ligamentum teres.   After adequate visualization, I excised the labrum, and then sequentially reamed.  I then impacted the acetabular implant into place using fluoroscopy for guidance.  Appropriate version and inclination was confirmed clinically matching their bony anatomy, and with fluoroscopy.  I placed a 20 mm screw in the posterior/superio position with an excellent bite.  I then placed the polyethylene liner in place  I then adducted the leg and released the external rotators from the posterior femur allowing it to be easily delivered up lateral and anterior to the acetabulum for preparation of the femoral canal.    I then prepared the proximal femur using the cookie-cutter and then sequentially reamed and broached.  A trial broach, neck, and head was  utilized, and I reduced the hip and used floroscopy to assess the neck length and femoral implant.  I then impacted the femoral prosthesis into place into the appropriate version. The hip was then reduced and fluoroscopy confirmed appropriate position. Leg lengths were restored.  I then irrigated the hip copiously again with, and repaired the fascia with Vicryl, followed by monocryl for the subcutaneous tissue, Monocryl for the skin, Steri-Strips and sterile gauze. The patient was then awakened and returned to PACU in stable and satisfactory condition. There were no complications.  POST OPERATIVE PLAN: WBAT, DVT px: SCD's/TED, ambulation and chemical dvt px  Kendra Holck, MD Orthopedic Surgeon [phone]     

## 2018-09-09 NOTE — Transfer of Care (Signed)
Immediate Anesthesia Transfer of Care Note  Patient: Kendra Simon  Procedure(s) Performed: Procedure(s): RIGHT TOTAL HIP ARTHROPLASTY ANTERIOR APPROACH (Right)  Patient Location: PACU  Anesthesia Type:General  Level of Consciousness:  sedated, patient cooperative and responds to stimulation  Airway & Oxygen Therapy:Patient Spontanous Breathing and Patient connected to face mask oxgen  Post-op Assessment:  Report given to PACU RN and Post -op Vital signs reviewed and stable  Post vital signs:  Reviewed and stable  Last Vitals:  Vitals:   09/09/18 0652 09/09/18 0933  BP: 112/75 131/68  Pulse: (!) 54 76  Resp: 18 13  Temp: 36.4 C 36.7 C  SpO2: 98% 94%    Complications: No apparent anesthesia complications

## 2018-09-09 NOTE — Plan of Care (Signed)
Plan of care 

## 2018-09-10 ENCOUNTER — Encounter (HOSPITAL_COMMUNITY): Payer: Self-pay | Admitting: Orthopedic Surgery

## 2018-09-10 LAB — GLUCOSE, CAPILLARY
Glucose-Capillary: 200 mg/dL — ABNORMAL HIGH (ref 70–99)
Glucose-Capillary: 215 mg/dL — ABNORMAL HIGH (ref 70–99)

## 2018-09-10 NOTE — Evaluation (Signed)
Occupational Therapy Evaluation Patient Details Name: Kendra Simon MRN: [id number] DOB: 06-Dec-1955 Today's Date: 09/10/2018    History of Present Illness 62 y.o. female with a history of MS, hypothyroidism, DM, GERD, HTN, HLD, and OSA (no CPAP) admitted for R THA-DA   Clinical Impression   Pt was admitted for the above sx.  All education was completed. No further OT is needed at this time     Follow Up Recommendations  No OT follow up    Equipment Recommendations  3 in 1 bedside commode    Recommendations for Other Services       Precautions / Restrictions Precautions Precautions: Fall Restrictions Other Position/Activity Restrictions: WBAT      Mobility Bed Mobility                  Transfers   Equipment used: Rolling walker (2 wheeled)   Sit to Stand: Min guard              Balance                                           ADL either performed or assessed with clinical judgement   ADL Overall ADL's : Needs assistance/impaired Eating/Feeding: Independent   Grooming: Oral care;Wash/dry hands;Standing   Upper Body Bathing: Set up   Lower Body Bathing: Minimal assistance   Upper Body Dressing : Set up   Lower Body Dressing: Minimal assistance;Moderate assistance;Sit to/from stand   Toilet Transfer: Min guard;Ambulation;RW   Toileting- Architect and Hygiene: Min guard;Sit to/from stand   Tub/ Shower Transfer: Min guard;Walk-in shower;Rolling walker     General ADL Comments: ambulated to bathroom, used commode, performed ADL and simulated shower stall transfer.  Pt will be able to do most of ADL if she is sitting on a little bit lower surface:  Educated on AE options, if needed     Vision         Perception     Praxis      Pertinent Vitals/Pain Pain Score: 4  Pain Location: R hip Pain Descriptors / Indicators: Sore Pain Intervention(s): Limited activity within patient's tolerance;Monitored during  session;Premedicated before session;Repositioned     Hand Dominance     Extremity/Trunk Assessment Upper Extremity Assessment Upper Extremity Assessment: Overall WFL for tasks assessed           Communication     Cognition Arousal/Alertness: Awake/alert Behavior During Therapy: WFL for tasks assessed/performed Overall Cognitive Status: Within Functional Limits for tasks assessed                                     General Comments       Exercises     Shoulder Instructions      Home Living Family/patient expects to be discharged to:: Private residence Living Arrangements: Spouse/significant other                 Bathroom Shower/Tub: Producer, television/film/video: Standard     Home Equipment: Cane - single point;Walker - 4 wheels;Shower seat - built in   Additional Comments: 3:1 Delivered      Prior Functioning/Environment Level of Independence: Independent with assistive device(s)        Comments: used rollator and cane  OT Problem List:        OT Treatment/Interventions:      OT Goals(Current goals can be found in the care plan section) Acute Rehab OT Goals Patient Stated Goal: walk with cane OT Goal Formulation: All assessment and education complete, DC therapy  OT Frequency:     Barriers to D/C:            Co-evaluation              AM-PAC OT "6 Clicks" Daily Activity     Outcome Measure Help from another person eating meals?: None Help from another person taking care of personal grooming?: A Little Help from another person toileting, which includes using toliet, bedpan, or urinal?: A Little Help from another person bathing (including washing, rinsing, drying)?: A Little Help from another person to put on and taking off regular upper body clothing?: A Little Help from another person to put on and taking off regular lower body clothing?: A Lot 6 Click Score: 18   End of Session    Activity Tolerance:  Patient tolerated treatment well Patient left: in chair;with call bell/phone within reach;with family/visitor present  OT Visit Diagnosis: Pain Pain - Right/Left: Right Pain - part of body: Hip                Time: 1610-9604 OT Time Calculation (min): 34 min Charges:  OT General Charges $OT Visit: 1 Visit OT Evaluation $OT Eval Low Complexity: 1 Low OT Treatments $Self Care/Home Management : 8-22 mins  Kendra Simon, OTR/L Acute Rehabilitation Services [phone] [facility] pager [phone] office 09/10/2018  Kendra Simon 09/10/2018, 12:07 PM

## 2018-09-10 NOTE — Progress Notes (Signed)
Physical Therapy Treatment Patient Details Name: Kendra Simon MRN: [id number] DOB: 10-12-1955 Today's Date: 09/10/2018    History of Present Illness 62 y.o. female with a history of MS, hypothyroidism, DM, GERD, HTN, HLD, and OSA (no CPAP) admitted for R THA-DA    PT Comments    Pt is progressing well with mobility and is ready to DC home from PT standpoint. She ambulated 41' with RW, completed stair training, and demonstrates good understanding of HEP.   Follow Up Recommendations  Follow surgeon's recommendation for DC plan and follow-up therapies;Supervision for mobility/OOB(HHPT per pt)     Equipment Recommendations  Rolling walker with 5" wheels;3in1 (PT)    Recommendations for Other Services       Precautions / Restrictions Precautions Precautions: Fall Precaution Comments: pt denies h/o falls in past 1 year Restrictions Weight Bearing Restrictions: No Other Position/Activity Restrictions: WBAT    Mobility  Bed Mobility               General bed mobility comments: up in recliner  Transfers Overall transfer level: Needs assistance Equipment used: Rolling walker (2 wheeled) Transfers: Sit to/from Stand Sit to Stand: Supervision         General transfer comment: VCs hand placement   Ambulation/Gait Ambulation/Gait assistance: Min guard Gait Distance (Feet): 90 Feet Assistive device: Rolling walker (2 wheeled) Gait Pattern/deviations: Step-to pattern Gait velocity: decr   General Gait Details: VCs sequencing, no loss of balance   Stairs Stairs: Yes   Stair Management: No rails;Backwards;Step to pattern;With walker Number of Stairs: 2 General stair comments: husband present and assisted with management of RW, VCs sequencing   Wheelchair Mobility    Modified Rankin (Stroke Patients Only)       Balance Overall balance assessment: Modified Independent                                          Cognition  Arousal/Alertness: Awake/alert Behavior During Therapy: WFL for tasks assessed/performed Overall Cognitive Status: Within Functional Limits for tasks assessed                                        Exercises Total Joint Exercises Ankle Circles/Pumps: AROM;Both;10 reps Quad Sets: AROM;Right;5 reps;Supine Short Arc Quad: AROM;Right;10 reps;Supine Heel Slides: AAROM;Right;10 reps;Supine Hip ABduction/ADduction: AAROM;Right;10 reps;Supine Long Arc Quad: AROM;Right;10 reps;Seated    General Comments        Pertinent Vitals/Pain Pain Score: 3  Pain Location: R hip Pain Descriptors / Indicators: Sore Pain Intervention(s): Limited activity within patient's tolerance;Monitored during session;Premedicated before session;Ice applied    Home Living Family/patient expects to be discharged to:: Private residence Living Arrangements: Spouse/significant other           Home Equipment: Gilmer Mor - single point;Walker - 4 wheels;Shower seat - built in Additional Comments: 3:1 Delivered    Prior Function Level of Independence: Independent with assistive device(s)      Comments: used rollator and cane   PT Goals (current goals can now be found in the care plan section) Acute Rehab PT Goals Patient Stated Goal: walk with cane, get in/out of car, go shopping PT Goal Formulation: With patient/family Time For Goal Achievement: 09/16/18 Potential to Achieve Goals: Good Progress towards PT goals: Progressing toward goals    Frequency  7X/week      PT Plan Current plan remains appropriate    Co-evaluation              AM-PAC PT "6 Clicks" Mobility   Outcome Measure  Help needed turning from your back to your side while in a flat bed without using bedrails?: A Little Help needed moving from lying on your back to sitting on the side of a flat bed without using bedrails?: A Little Help needed moving to and from a bed to a chair (including a wheelchair)?:  None Help needed standing up from a chair using your arms (e.g., wheelchair or bedside chair)?: None Help needed to walk in hospital room?: None Help needed climbing 3-5 steps with a railing? : A Little 6 Click Score: 21    End of Session Equipment Utilized During Treatment: Gait belt Activity Tolerance: Patient tolerated treatment well Patient left: in chair;with call bell/phone within reach;with family/visitor present Nurse Communication: Mobility status PT Visit Diagnosis: Difficulty in walking, not elsewhere classified (R26.2);Pain;Muscle weakness (generalized) (M62.81) Pain - Right/Left: Right Pain - part of body: Hip     Time: 1610-9604 PT Time Calculation (min) (ACUTE ONLY): 36 min  Charges:  $Gait Training: 8-22 mins $Therapeutic Exercise: 8-22 mins                     Ralene Bathe Kistler PT 09/10/2018  Acute Rehabilitation Services Pager [phone] Office [phone]

## 2018-09-10 NOTE — Care Management Note (Signed)
Case Management Note  Patient Details  Name: [name] MRN: [id number] Date of Birth: [birth year]  Subjective/Objective:   Discharge planning, spoke with patient and spouse at bedside. Have chosen [facility] [place] PT, evaluate and treat.   Action/Plan: Contacted [facility] for referral. Needs RW and 3n1, [employer] to deliver to room. [phone]                 Expected Discharge Date:  09/10/18               Expected Discharge Plan:  Home w Home Health Services  In-House Referral:  NA  Discharge planning Services  CM Consult  Post Acute Care Choice:  Durable Medical Equipment, Home Health Choice offered to:  Patient  DME Arranged:  3-N-1, Walker rolling DME Agency:  [employer]  HH Arranged:  PT HH Agency:  [facility] [employer] [facility]  Status of Service:  Completed, signed off  If discussed at [employer] [facility] [employer], dates discussed:    Additional Comments:  Alexis Goodell, RN 09/10/2018, 9:31 AM

## 2018-09-10 NOTE — Progress Notes (Signed)
    Subjective: Patient reports pain as mild.  Tolerating diet.  Urinating.   No CP, SOB.  OOB w/ PT.  Objective:   VITALS:   Vitals:   09/09/18 2029 09/09/18 2152 09/10/18 0203 09/10/18 0551  BP: 124/66 107/64 104/83 115/70  Pulse: 69 67 70 69  Resp:  14 14 16   Temp: 98.4 F (36.9 C) 98.2 F (36.8 C) 98.6 F (37 C) 97.7 F (36.5 C)  TempSrc: Oral Oral    SpO2:  96% 93% 96%  Weight:      Height:       CBC Latest Ref Rng & Units 09/02/2018 11/21/2014 04/18/2011  WBC 4.0 - 10.5 K/uL 8.2 9.6 6.3  Hemoglobin 12.0 - 15.0 g/dL 16.1 09.6 11.9(L)  Hematocrit 36.0 - 46.0 % 38.6 36.4 35.3(L)  Platelets 150 - 400 K/uL 386 324 271   BMP Latest Ref Rng & Units 09/02/2018 11/21/2014 04/18/2011  Glucose 70 - 99 mg/dL 045(W) 098(J) 191(Y)  BUN 6 - 20 mg/dL 14 15 16   Creatinine 0.44 - 1.00 mg/dL 7.82 9.56 2.13  Sodium 135 - 145 mmol/L 142 138 140  Potassium 3.5 - 5.1 mmol/L 3.6 3.4(L) 3.4(L)  Chloride 98 - 111 mmol/L 99 101 103  CO2 22 - 32 mmol/L 31 27 26   Calcium 8.9 - 10.3 mg/dL 08.6 9.9 9.0   Intake/Output      12/03 0701 - 12/04 0700 12/04 0701 - 12/05 0700   P.O. 840    I.V. (mL/kg) 2471.2 (33.2)    IV Piggyback 50    Total Intake(mL/kg) 3361.2 (45.1)    Urine (mL/kg/hr) 1800 (1)    Blood 150    Total Output 1950    Net +1411.2            Physical Exam: General: NAD.  Upright in bed on arrival.  Calm, conversant.  Husband at bedside. Resp: No increased wob Cardio: regular rate and rhythm ABD soft Neurologically intact MSK Neurovascularly intact Sensation intact distally Intact pulses distally Dorsiflexion/Plantar flexion intact Incision: dressing C/D/I   Assessment: 1 Day Post-Op  S/P Procedure(s) (LRB): RIGHT TOTAL HIP ARTHROPLASTY ANTERIOR APPROACH (Right) by Dr. Jewel Baize. Murphy on 09/09/18  Principal Problem:   Primary osteoarthritis of right hip Active Problems:   Multiple sclerosis (HCC)   Primary localized osteoarthritis of hip   Primary  osteoarthritis, status post total hip arthroplasty Doing well postop day 1 Eating, drinking, and voiding well Pain control Good early mobilization   Plan: Up with therapy Incentive Spirometry Apply ice   Weight Bearing: Weight Bearing as Tolerated (WBAT)  Dressings: Maintain Mepilex.   Equipment: 3:1 bedside commode, 2 wheeled walker VTE prophylaxis: Aspirin, SCDs, ambulation Dispo: Home today   Kendra Simon III, PA-C 09/10/2018, 7:57 AM

## 2018-09-10 NOTE — Discharge Summary (Signed)
Discharge Summary  Patient ID: Kendra Simon MRN: [id number] DOB/AGE: 60/14/1957 60 y.o.  Admit date: 09/09/2018 Discharge date: 09/10/2018  Admission Diagnoses:  Primary osteoarthritis of right hip  Discharge Diagnoses:  Principal Problem:   Primary osteoarthritis of right hip Active Problems:   Multiple sclerosis (HCC)   Primary localized osteoarthritis of hip   Past Medical History:  Diagnosis Date  . Anxiety   . Complication of anesthesia    slow to wake   . Depression   . Diabetes mellitus without complication (HCC)    managing with diet and lifestyle   . Essential hypertension, malignant   . Hypopotassemia   . Multiple sclerosis (HCC)    dx age 66   . OSA (obstructive sleep apnea) 02/17/2012   no device in ue   . Pure hypercholesterolemia   . TIA (transient ischemic attack) age 31  . Unspecified hypothyroidism   . Unspecified pruritic disorder     Surgeries: Procedure(s): RIGHT TOTAL HIP ARTHROPLASTY ANTERIOR APPROACH on 09/09/2018   Consultants (if any):   Discharged Condition: Improved  Hospital Course: Kendra Simon is an 62 y.o. female who was admitted 09/09/2018 with a diagnosis of Primary osteoarthritis of right hip and went to the operating room on 09/09/2018 and underwent the above named procedures.    She was given perioperative antibiotics:  Anti-infectives (From admission, onward)   Start     Dose/Rate Route Frequency Ordered Stop   09/09/18 1400  ceFAZolin (ANCEF) IVPB 1 g/50 mL premix     1 g 100 mL/hr over 30 Minutes Intravenous Every 6 hours 09/09/18 1057 09/09/18 2130   09/09/18 0600  ceFAZolin (ANCEF) IVPB 2g/100 mL premix     2 g 200 mL/hr over 30 Minutes Intravenous On call to O.R. 09/09/18 8657 09/09/18 0741    .  She was given sequential compression devices, early ambulation, and Aspirin for DVT prophylaxis.  She benefited maximally from the hospital stay and there were no complications.    Recent vital signs:  Vitals:    09/10/18 0203 09/10/18 0551  BP: 104/83 115/70  Pulse: 70 69  Resp: 14 16  Temp: 98.6 F (37 C) 97.7 F (36.5 C)  SpO2: 93% 96%    Recent laboratory studies:  Lab Results  Component Value Date   HGB 12.5 09/02/2018   HGB 12.2 11/21/2014   HGB 11.9 (L) 04/18/2011   Lab Results  Component Value Date   WBC 8.2 09/02/2018   PLT 386 09/02/2018   Lab Results  Component Value Date   INR 1.01 04/18/2011   Lab Results  Component Value Date   NA 142 09/02/2018   K 3.6 09/02/2018   CL 99 09/02/2018   CO2 31 09/02/2018   BUN 14 09/02/2018   CREATININE 0.89 09/02/2018   GLUCOSE 160 (H) 09/02/2018    Discharge Medications:   Allergies as of 09/10/2018      Reactions   Metformin And Related Nausea Only, Other (See Comments)   Metformin ER nausea and dizziness      Medication List    TAKE these medications   acetaminophen 500 MG tablet Commonly known as:  TYLENOL Take 2 tablets (1,000 mg total) by mouth every 8 (eight) hours for 14 days. For Pain.   ALPRAZolam 0.5 MG tablet Commonly known as:  XANAX Take 0.5 mg by mouth 4 (four) times daily.   amLODipine 5 MG tablet Commonly known as:  NORVASC Take 5 mg by mouth daily.   amphetamine-dextroamphetamine  20 MG tablet Commonly known as:  ADDERALL Take 20 mg by mouth 2 (two) times daily.   aspirin EC 81 MG tablet Take 1 tablet (81 mg total) by mouth 2 (two) times daily. For DVT prophylaxis for 30 days after surgery.   baclofen 10 MG tablet Commonly known as:  LIORESAL Take 10 mg by mouth 3 (three) times daily as needed for muscle spasms.   carbamazepine 300 MG 12 hr capsule Commonly known as:  CARBATROL Take 300 mg by mouth daily.   docusate sodium 100 MG capsule Commonly known as:  COLACE Take 1 capsule (100 mg total) by mouth 2 (two) times daily. To prevent constipation while taking pain medication.   FLUoxetine 40 MG capsule Commonly known as:  PROZAC Take 40 mg by mouth daily.   gabapentin 300 MG  capsule Commonly known as:  NEURONTIN Take 900 mg by mouth 4 (four) times daily.   HYDROcodone-acetaminophen 7.5-325 MG tablet Commonly known as:  NORCO Take 1 tablet by mouth 2 (two) times daily as needed for moderate pain.   ibuprofen 800 MG tablet Commonly known as:  ADVIL,MOTRIN Take 800 mg by mouth every 8 (eight) hours as needed for headache or moderate pain.   levalbuterol 45 MCG/ACT inhaler Commonly known as:  XOPENEX HFA Inhale 1 puff into the lungs 3 (three) times daily as needed for wheezing or shortness of breath.   levothyroxine 50 MCG tablet Commonly known as:  SYNTHROID, LEVOTHROID Take 50 mcg by mouth daily before breakfast.   LINZESS 290 MCG Caps capsule Generic drug:  linaclotide Take 290 mcg by mouth daily before breakfast.   montelukast 10 MG tablet Commonly known as:  SINGULAIR Take 10 mg by mouth daily.   ondansetron 4 MG tablet Commonly known as:  ZOFRAN Take 1 tablet (4 mg total) by mouth every 8 (eight) hours as needed for nausea or vomiting.   oxybutynin 5 MG tablet Commonly known as:  DITROPAN Take 5 mg by mouth 3 (three) times daily as needed for bladder spasms.   RABEprazole 20 MG tablet Commonly known as:  ACIPHEX Take 20 mg by mouth daily.   simvastatin 20 MG tablet Commonly known as:  ZOCOR Take 20 mg by mouth at bedtime.   SYSTANE 0.4-0.3 % Soln Generic drug:  Polyethyl Glycol-Propyl Glycol Place 1 drop into both eyes daily.   temazepam 15 MG capsule Commonly known as:  RESTORIL Take 15 mg by mouth at bedtime as needed for sleep.   triamterene-hydrochlorothiazide 37.5-25 MG tablet Commonly known as:  MAXZIDE-25 Take 1 tablet by mouth daily.   valACYclovir 1000 MG tablet Commonly known as:  VALTREX Take 1,000 mg by mouth daily as needed (for cold sores).   VITAMIN D3 PO Take 1 capsule by mouth at bedtime.       Diagnostic Studies: Dg C-arm 1-60 Min-no Report  Result Date: 09/09/2018 Fluoroscopy was utilized by the  requesting physician.  No radiographic interpretation.   Dg Hip Operative Unilat W Or W/o Pelvis Right  Result Date: 09/09/2018 CLINICAL DATA:  RIGHT hip replacement. EXAM: OPERATIVE RIGHT HIP (WITH PELVIS IF PERFORMED) 2 VIEWS TECHNIQUE: Fluoroscopic spot image(s) were submitted for interpretation post-operatively. COMPARISON:  None. FINDINGS: Patient is status post RIGHT THA. No adverse features. IMPRESSION: Satisfactory postoperative appearance. Electronically Signed   By: Elsie Stain M.D.   On: 09/09/2018 09:19    Disposition: Discharge disposition: 01-Home or Self Care       Discharge Instructions    Discharge patient  Complete by:  As directed    After A.M. PT session.   Discharge disposition:  01-Home or Self Care   Discharge patient date:  09/10/2018      Follow-up Information    Sheral Apley, MD Follow up.   Specialty:  Orthopedic Surgery Contact information: [address] [place] [address] [phone]            Signed: Albina Billet III PA-C 09/10/2018, 7:56 AM

## 2018-09-29 ENCOUNTER — Other Ambulatory Visit (HOSPITAL_COMMUNITY): Payer: Medicare Other

## 2018-10-17 ENCOUNTER — Encounter (HOSPITAL_COMMUNITY): Admission: RE | Admit: 2018-10-17 | Payer: Medicare Other | Source: Ambulatory Visit

## 2018-10-31 ENCOUNTER — Encounter (HOSPITAL_COMMUNITY): Payer: Medicare Other

## 2019-01-06 ENCOUNTER — Other Ambulatory Visit: Payer: Self-pay | Admitting: Psychiatry

## 2019-01-06 DIAGNOSIS — G35 Multiple sclerosis: Secondary | ICD-10-CM

## 2019-01-29 ENCOUNTER — Other Ambulatory Visit (HOSPITAL_COMMUNITY): Payer: Self-pay | Admitting: *Deleted

## 2019-02-05 ENCOUNTER — Ambulatory Visit (HOSPITAL_COMMUNITY): Payer: Medicare Other

## 2019-02-05 ENCOUNTER — Encounter (HOSPITAL_COMMUNITY): Payer: Self-pay

## 2019-02-18 ENCOUNTER — Ambulatory Visit (HOSPITAL_COMMUNITY)
Admission: RE | Admit: 2019-02-18 | Discharge: 2019-02-18 | Disposition: A | Discharge status: Home / Self Care | Payer: Medicare HMO | Source: Ambulatory Visit | Attending: Psychiatry | Admitting: Psychiatry

## 2019-02-18 ENCOUNTER — Encounter (HOSPITAL_COMMUNITY): Payer: Self-pay

## 2019-02-18 ENCOUNTER — Ambulatory Visit (HOSPITAL_COMMUNITY): Payer: Medicare Other

## 2019-02-18 ENCOUNTER — Encounter (HOSPITAL_COMMUNITY): Payer: Medicare Other

## 2019-02-18 ENCOUNTER — Other Ambulatory Visit: Payer: Self-pay

## 2019-02-18 DIAGNOSIS — G35 Multiple sclerosis: Secondary | ICD-10-CM | POA: Insufficient documentation

## 2019-02-18 MED ORDER — ACETAMINOPHEN 325 MG PO TABS
ORAL_TABLET | ORAL | Status: AC
  Filled 2019-02-18: qty 2

## 2019-02-18 MED ORDER — DIPHENHYDRAMINE HCL 50 MG/ML IJ SOLN
INTRAMUSCULAR | Status: AC
  Administered 2019-02-18: 25 mg via INTRAVENOUS
  Filled 2019-02-18: qty 1

## 2019-02-18 MED ORDER — SODIUM CHLORIDE 0.9 % IV SOLN
1000.0000 mg | Freq: Once | INTRAVENOUS | Status: AC
  Administered 2019-02-18: 1000 mg via INTRAVENOUS
  Filled 2019-02-18: qty 100

## 2019-02-18 MED ORDER — DIPHENHYDRAMINE HCL 50 MG/ML IJ SOLN
25.0000 mg | Freq: Once | INTRAMUSCULAR | Status: AC
  Administered 2019-02-18: 08:00:00 25 mg via INTRAVENOUS

## 2019-02-18 MED ORDER — METHYLPREDNISOLONE SODIUM SUCC 125 MG IJ SOLR
INTRAMUSCULAR | Status: AC
  Administered 2019-02-18: 125 mg via INTRAVENOUS
  Filled 2019-02-18: qty 2

## 2019-02-18 MED ORDER — SODIUM CHLORIDE 0.9 % IV SOLN
INTRAVENOUS | Status: DC
  Administered 2019-02-18: 08:00:00 via INTRAVENOUS

## 2019-02-18 MED ORDER — METHYLPREDNISOLONE SODIUM SUCC 125 MG IJ SOLR
125.0000 mg | Freq: Once | INTRAMUSCULAR | Status: AC
  Administered 2019-02-18: 08:00:00 125 mg via INTRAVENOUS

## 2019-02-18 MED ORDER — ACETAMINOPHEN 325 MG PO TABS
650.0000 mg | ORAL_TABLET | Freq: Once | ORAL | Status: AC
  Administered 2019-02-18: 650 mg via ORAL

## 2019-03-06 ENCOUNTER — Encounter (HOSPITAL_COMMUNITY): Payer: Medicare Other

## 2019-03-06 ENCOUNTER — Encounter (HOSPITAL_COMMUNITY): Payer: Self-pay

## 2019-03-06 ENCOUNTER — Other Ambulatory Visit: Payer: Self-pay

## 2019-03-06 ENCOUNTER — Ambulatory Visit (HOSPITAL_COMMUNITY)
Admission: RE | Admit: 2019-03-06 | Discharge: 2019-03-06 | Disposition: A | Discharge status: Home / Self Care | Payer: Medicare HMO | Source: Ambulatory Visit | Attending: Psychiatry | Admitting: Psychiatry

## 2019-03-06 DIAGNOSIS — G35 Multiple sclerosis: Secondary | ICD-10-CM | POA: Diagnosis not present

## 2019-03-06 MED ORDER — ACETAMINOPHEN 325 MG PO TABS
ORAL_TABLET | ORAL | Status: AC
  Administered 2019-03-06: 650 mg via ORAL
  Filled 2019-03-06: qty 2

## 2019-03-06 MED ORDER — SODIUM CHLORIDE 0.9 % IV SOLN
1000.0000 mg | Freq: Once | INTRAVENOUS | Status: AC
  Administered 2019-03-06: 1000 mg via INTRAVENOUS
  Filled 2019-03-06: qty 100

## 2019-03-06 MED ORDER — METHYLPREDNISOLONE SODIUM SUCC 125 MG IJ SOLR
INTRAMUSCULAR | Status: AC
  Administered 2019-03-06: 125 mg via INTRAVENOUS
  Filled 2019-03-06: qty 2

## 2019-03-06 MED ORDER — METHYLPREDNISOLONE SODIUM SUCC 125 MG IJ SOLR
125.0000 mg | Freq: Once | INTRAMUSCULAR | Status: AC
  Administered 2019-03-06: 125 mg via INTRAVENOUS

## 2019-03-06 MED ORDER — DIPHENHYDRAMINE HCL 50 MG/ML IJ SOLN
INTRAMUSCULAR | Status: AC
  Administered 2019-03-06: 50 mg
  Filled 2019-03-06: qty 1

## 2019-03-06 MED ORDER — SODIUM CHLORIDE 0.9 % IV SOLN
INTRAVENOUS | Status: AC
  Administered 2019-03-06: 10:00:00 via INTRAVENOUS

## 2019-03-06 MED ORDER — ACETAMINOPHEN 325 MG PO TABS
650.0000 mg | ORAL_TABLET | Freq: Once | ORAL | Status: AC
  Administered 2019-03-06: 650 mg via ORAL

## 2019-03-06 MED ORDER — DIPHENHYDRAMINE HCL 50 MG/ML IJ SOLN: 25.0000 mg | Freq: Once | INTRAMUSCULAR | Status: DC

## 2019-03-11 ENCOUNTER — Ambulatory Visit (HOSPITAL_COMMUNITY)
Admission: RE | Admit: 2019-03-11 | Discharge: 2019-03-11 | Disposition: A | Discharge status: Home / Self Care | Payer: Medicare HMO | Source: Ambulatory Visit | Attending: Psychiatry | Admitting: Psychiatry

## 2019-03-11 ENCOUNTER — Other Ambulatory Visit: Payer: Self-pay

## 2019-03-11 DIAGNOSIS — G35 Multiple sclerosis: Secondary | ICD-10-CM | POA: Insufficient documentation

## 2019-03-11 LAB — POCT I-STAT CREATININE: Creatinine, Ser: 0.9 mg/dL (ref 0.44–1.00)

## 2019-03-11 MED ORDER — GADOBUTROL 1 MMOL/ML IV SOLN
8.0000 mL | Freq: Once | INTRAVENOUS | Status: AC | PRN
  Administered 2019-03-11: 8 mL via INTRAVENOUS

## 2019-04-17 ENCOUNTER — Encounter (HOSPITAL_COMMUNITY): Payer: Medicare Other

## 2019-07-28 ENCOUNTER — Observation Stay (HOSPITAL_COMMUNITY): Payer: Medicare HMO

## 2019-08-12 ENCOUNTER — Ambulatory Visit (HOSPITAL_COMMUNITY)
Admission: RE | Admit: 2019-08-12 | Discharge: 2019-08-12 | Disposition: A | Discharge status: Home / Self Care | Payer: Medicare HMO | Source: Ambulatory Visit | Attending: Psychiatry | Admitting: Psychiatry

## 2019-08-12 ENCOUNTER — Encounter (HOSPITAL_COMMUNITY): Payer: Self-pay

## 2019-08-12 ENCOUNTER — Other Ambulatory Visit: Payer: Self-pay

## 2019-08-12 DIAGNOSIS — G35 Multiple sclerosis: Secondary | ICD-10-CM | POA: Diagnosis not present

## 2019-08-12 MED ORDER — SODIUM CHLORIDE 0.9 % IV SOLN
1000.0000 mg | INTRAVENOUS | Status: DC
  Administered 2019-08-12: 09:00:00 1000 mg via INTRAVENOUS
  Filled 2019-08-12: qty 100

## 2019-08-12 MED ORDER — SODIUM CHLORIDE 0.9 % IV SOLN
Freq: Once | INTRAVENOUS | Status: AC
  Administered 2019-08-12: 09:00:00 via INTRAVENOUS

## 2019-08-12 MED ORDER — ACETAMINOPHEN 325 MG PO TABS
650.0000 mg | ORAL_TABLET | ORAL | Status: DC
  Administered 2019-08-12: 09:00:00 650 mg via ORAL
  Filled 2019-08-12: qty 2

## 2019-08-12 MED ORDER — ACETAMINOPHEN 325 MG PO TABS: 650.0000 mg | ORAL_TABLET | ORAL | Status: DC

## 2019-08-12 MED ORDER — DIPHENHYDRAMINE HCL 50 MG/ML IJ SOLN
25.0000 mg | INTRAMUSCULAR | Status: DC
  Administered 2019-08-12: 09:00:00 25 mg via INTRAVENOUS
  Filled 2019-08-12: qty 1

## 2019-08-12 MED ORDER — SODIUM CHLORIDE 0.9 % IV SOLN: Freq: Once | INTRAVENOUS | Status: DC

## 2019-08-12 MED ORDER — METHYLPREDNISOLONE SODIUM SUCC 125 MG IJ SOLR
125.0000 mg | INTRAMUSCULAR | Status: DC
  Administered 2019-08-12: 09:00:00 125 mg via INTRAVENOUS
  Filled 2019-08-12: qty 2

## 2019-08-12 MED ORDER — SODIUM CHLORIDE 0.9 % IV SOLN: 1000.0000 mg | INTRAVENOUS | Status: DC

## 2019-08-12 MED ORDER — DIPHENHYDRAMINE HCL 50 MG/ML IJ SOLN: 25.0000 mg | INTRAMUSCULAR | Status: DC

## 2019-08-12 MED ORDER — METHYLPREDNISOLONE SODIUM SUCC 125 MG IJ SOLR: 125.0000 mg | INTRAMUSCULAR | Status: DC

## 2019-08-12 NOTE — Progress Notes (Signed)
1315 tolerated infusion well. VSS; no reactions.   Next appointment 09/01/19- given appointment list

## 2019-08-12 NOTE — Progress Notes (Addendum)
Patient states that they increased infusion slower and she did better.  Reviewed last infusion- increased at 50mg  instead of 100mg . Provided premeds  Started infusion at 0913 at 100mg  (increasing at 50mg  every 30 min; per pt. Request)

## 2019-08-12 NOTE — Progress Notes (Addendum)
76 called MD- left message. 0900 Called MD- during MCA checklist patinet states has Flu Vaccination end of Sept or Oct.   Called and verified to continue with infusion today.  Garrel Ridgel- RN for Dr. Jacqulynn Cadet states OK to proceed with infusion of Rituxan.

## 2019-08-25 ENCOUNTER — Encounter (HOSPITAL_COMMUNITY): Payer: Medicare Other

## 2019-08-25 ENCOUNTER — Encounter (HOSPITAL_COMMUNITY): Payer: 59

## 2019-09-01 ENCOUNTER — Encounter (HOSPITAL_COMMUNITY)
Admission: RE | Admit: 2019-09-01 | Discharge: 2019-09-01 | Disposition: A | Discharge status: Home / Self Care | Payer: Medicare HMO | Source: Ambulatory Visit | Attending: Psychiatry | Admitting: Psychiatry

## 2019-09-01 ENCOUNTER — Other Ambulatory Visit: Payer: Self-pay

## 2019-09-01 ENCOUNTER — Encounter (HOSPITAL_COMMUNITY): Payer: Self-pay

## 2019-09-01 DIAGNOSIS — G35 Multiple sclerosis: Secondary | ICD-10-CM | POA: Insufficient documentation

## 2019-09-01 MED ORDER — SODIUM CHLORIDE 0.9 % IV SOLN
INTRAVENOUS | Status: DC
  Administered 2019-09-01: 08:00:00 via INTRAVENOUS

## 2019-09-01 MED ORDER — ACETAMINOPHEN 325 MG PO TABS
650.0000 mg | ORAL_TABLET | ORAL | Status: DC
  Administered 2019-09-01: 650 mg via ORAL
  Filled 2019-09-01: qty 2

## 2019-09-01 MED ORDER — SODIUM CHLORIDE 0.9 % IV SOLN
1000.0000 mg | INTRAVENOUS | Status: DC
  Administered 2019-09-01: 1000 mg via INTRAVENOUS
  Filled 2019-09-01: qty 100

## 2019-09-01 MED ORDER — METHYLPREDNISOLONE SODIUM SUCC 125 MG IJ SOLR
125.0000 mg | INTRAMUSCULAR | Status: DC
  Administered 2019-09-01: 125 mg via INTRAVENOUS
  Filled 2019-09-01: qty 2

## 2019-09-01 MED ORDER — DIPHENHYDRAMINE HCL 50 MG/ML IJ SOLN
25.0000 mg | INTRAMUSCULAR | Status: DC
  Administered 2019-09-01: 50 mg via INTRAVENOUS
  Filled 2019-09-01: qty 1

## 2019-09-01 NOTE — Discharge Instructions (Signed)
Rituximab injection (infusion) What is this medicine? RITUXIMAB (ri TUX i mab) is a monoclonal antibody. It is used to treat certain types of cancer like non-Hodgkin lymphoma and chronic lymphocytic leukemia. It is also used to treat rheumatoid arthritis, granulomatosis with polyangiitis (or Wegener's granulomatosis), microscopic polyangiitis, and pemphigus vulgaris. This medicine may be used for other purposes; ask your health care provider or pharmacist if you have questions. COMMON BRAND NAME(S): Rituxan, RUXIENCE What should I tell my health care provider before I take this medicine? They need to know if you have any of these conditions:  heart disease  infection (especially a virus infection such as hepatitis B, chickenpox, cold sores, or herpes)  immune system problems  irregular heartbeat  kidney disease  low blood counts, like low white cell, platelet, or red cell counts  lung or breathing disease, like asthma  recently received or scheduled to receive a vaccine  an unusual or allergic reaction to rituximab, other medicines, foods, dyes, or preservatives  pregnant or trying to get pregnant  breast-feeding How should I use this medicine? This medicine is for infusion into a vein. It is administered in a hospital or clinic by a specially trained health care professional. A special MedGuide will be given to you by the pharmacist with each prescription and refill. Be sure to read this information carefully each time. Talk to your pediatrician regarding the use of this medicine in children. This medicine is not approved for use in children. Overdosage: If you think you have taken too much of this medicine contact a poison control center or emergency room at once. NOTE: This medicine is only for you. Do not share this medicine with others. What if I miss a dose? It is important not to miss a dose. Call your doctor or health care professional if you are unable to keep an  appointment. What may interact with this medicine?  cisplatin  live virus vaccines This list may not describe all possible interactions. Give your health care provider a list of all the medicines, herbs, non-prescription drugs, or dietary supplements you use. Also tell them if you smoke, drink alcohol, or use illegal drugs. Some items may interact with your medicine. What should I watch for while using this medicine? Your condition will be monitored carefully while you are receiving this medicine. You may need blood work done while you are taking this medicine. This medicine can cause serious allergic reactions. To reduce your risk you may need to take medicine before treatment with this medicine. Take your medicine as directed. In some patients, this medicine may cause a serious brain infection that may cause death. If you have any problems seeing, thinking, speaking, walking, or standing, tell your healthcare professional right away. If you cannot reach your healthcare professional, urgently seek other source of medical care. Call your doctor or health care professional for advice if you get a fever, chills or sore throat, or other symptoms of a cold or flu. Do not treat yourself. This drug decreases your body's ability to fight infections. Try to avoid being around people who are sick. Do not become pregnant while taking this medicine or for at least 12 months after stopping it. Women should inform their doctor if they wish to become pregnant or think they might be pregnant. There is a potential for serious side effects to an unborn child. Talk to your health care professional or pharmacist for more information. Do not breast-feed an infant while taking this medicine or for  at least 6 months after stopping it. What side effects may I notice from receiving this medicine? Side effects that you should report to your doctor or health care professional as soon as possible:  allergic reactions like skin  rash, itching or hives; swelling of the face, lips, or tongue  breathing problems  chest pain  changes in vision  diarrhea  headache with fever, neck stiffness, sensitivity to light, nausea, or confusion  fast, irregular heartbeat  loss of memory  low blood counts - this medicine may decrease the number of white blood cells, red blood cells and platelets. You may be at increased risk for infections and bleeding.  mouth sores  problems with balance, talking, or walking  redness, blistering, peeling or loosening of the skin, including inside the mouth  signs of infection - fever or chills, cough, sore throat, pain or difficulty passing urine  signs and symptoms of kidney injury like trouble passing urine or change in the amount of urine  signs and symptoms of liver injury like dark yellow or brown urine; general ill feeling or flu-like symptoms; light-colored stools; loss of appetite; nausea; right upper belly pain; unusually weak or tired; yellowing of the eyes or skin  signs and symptoms of low blood pressure like dizziness; feeling faint or lightheaded, falls; unusually weak or tired  stomach pain  swelling of the ankles, feet, hands  unusual bleeding or bruising  vomiting Side effects that usually do not require medical attention (report to your doctor or health care professional if they continue or are bothersome):  headache  joint pain  muscle cramps or muscle pain  nausea  tiredness This list may not describe all possible side effects. Call your doctor for medical advice about side effects. You may report side effects to FDA at 1-800-FDA-1088. Where should I keep my medicine? This drug is given in a hospital or clinic and will not be stored at home. NOTE: This sheet is a summary. It may not cover all possible information. If you have questions about this medicine, talk to your doctor, pharmacist, or health care provider.  2020 [employer]/Gold Standard  (2018-11-05 22:01:36)

## 2019-09-08 ENCOUNTER — Encounter (HOSPITAL_COMMUNITY): Payer: 59

## 2020-01-01 ENCOUNTER — Other Ambulatory Visit: Payer: Self-pay

## 2020-01-01 ENCOUNTER — Ambulatory Visit
Admission: RE | Admit: 2020-01-01 | Discharge: 2020-01-01 | Disposition: A | Discharge status: Home / Self Care | Payer: Medicare HMO | Source: Ambulatory Visit | Attending: Internal Medicine | Admitting: Internal Medicine

## 2020-01-01 ENCOUNTER — Other Ambulatory Visit: Payer: Self-pay | Admitting: Internal Medicine

## 2020-01-01 DIAGNOSIS — R109 Unspecified abdominal pain: Secondary | ICD-10-CM

## 2020-01-26 ENCOUNTER — Ambulatory Visit (HOSPITAL_COMMUNITY): Payer: Medicare HMO

## 2020-02-17 ENCOUNTER — Other Ambulatory Visit: Payer: Self-pay | Admitting: Neurology

## 2020-02-17 ENCOUNTER — Other Ambulatory Visit (HOSPITAL_COMMUNITY): Payer: Self-pay | Admitting: Neurology

## 2020-02-17 DIAGNOSIS — G35 Multiple sclerosis: Secondary | ICD-10-CM

## 2020-03-01 ENCOUNTER — Ambulatory Visit (HOSPITAL_COMMUNITY)
Admission: RE | Admit: 2020-03-01 | Discharge: 2020-03-01 | Disposition: A | Discharge status: Home / Self Care | Payer: Medicare HMO | Source: Ambulatory Visit | Attending: Neurology | Admitting: Neurology

## 2020-03-01 ENCOUNTER — Other Ambulatory Visit: Payer: Self-pay

## 2020-03-01 DIAGNOSIS — G35 Multiple sclerosis: Secondary | ICD-10-CM | POA: Insufficient documentation

## 2020-03-04 ENCOUNTER — Inpatient Hospital Stay (HOSPITAL_COMMUNITY): Admission: RE | Admit: 2020-03-04 | Payer: Medicare HMO | Source: Ambulatory Visit

## 2020-07-12 ENCOUNTER — Ambulatory Visit: Payer: Self-pay

## 2020-11-08 DEATH — deceased
